# Patient Record
Sex: Female | Born: 1975 | Race: White | Hispanic: No | Smoking: Current every day smoker
Health system: Southern US, Community
[De-identification: ages and names within clinical notes are randomized; demographics above are authoritative.]

## PROBLEM LIST (undated history)

## (undated) DIAGNOSIS — K589 Irritable bowel syndrome without diarrhea: Secondary | ICD-10-CM

## (undated) DIAGNOSIS — F419 Anxiety disorder, unspecified: Secondary | ICD-10-CM

## (undated) DIAGNOSIS — F319 Bipolar disorder, unspecified: Secondary | ICD-10-CM

## (undated) DIAGNOSIS — F32A Depression, unspecified: Secondary | ICD-10-CM

## (undated) DIAGNOSIS — F99 Mental disorder, not otherwise specified: Secondary | ICD-10-CM

## (undated) DIAGNOSIS — B009 Herpesviral infection, unspecified: Secondary | ICD-10-CM

## (undated) DIAGNOSIS — N6320 Unspecified lump in the left breast, unspecified quadrant: Secondary | ICD-10-CM

## (undated) DIAGNOSIS — J45909 Unspecified asthma, uncomplicated: Secondary | ICD-10-CM

## (undated) DIAGNOSIS — K219 Gastro-esophageal reflux disease without esophagitis: Secondary | ICD-10-CM

## (undated) DIAGNOSIS — J9819 Other pulmonary collapse: Secondary | ICD-10-CM

## (undated) DIAGNOSIS — F329 Major depressive disorder, single episode, unspecified: Secondary | ICD-10-CM

## (undated) HISTORY — PX: TONSILLECTOMY: SUR1361

## (undated) HISTORY — PX: TUBAL LIGATION: SHX77

---

## 2000-01-27 ENCOUNTER — Other Ambulatory Visit: Admission: RE | Admit: 2000-01-27 | Discharge: 2000-01-27 | Payer: Self-pay | Admitting: Otolaryngology

## 2002-02-24 ENCOUNTER — Emergency Department (HOSPITAL_COMMUNITY): Admission: EM | Admit: 2002-02-24 | Discharge: 2002-02-24 | Payer: Self-pay | Admitting: Emergency Medicine

## 2002-03-19 ENCOUNTER — Other Ambulatory Visit: Admission: RE | Admit: 2002-03-19 | Discharge: 2002-03-19 | Payer: Self-pay | Admitting: Obstetrics and Gynecology

## 2002-04-05 ENCOUNTER — Inpatient Hospital Stay (HOSPITAL_COMMUNITY): Admission: AD | Admit: 2002-04-05 | Discharge: 2002-04-05 | Payer: Self-pay | Admitting: Obstetrics and Gynecology

## 2002-08-07 ENCOUNTER — Encounter: Payer: Self-pay | Admitting: Obstetrics and Gynecology

## 2002-08-07 ENCOUNTER — Inpatient Hospital Stay (HOSPITAL_COMMUNITY): Admission: AD | Admit: 2002-08-07 | Discharge: 2002-08-07 | Payer: Self-pay | Admitting: Obstetrics and Gynecology

## 2002-09-21 ENCOUNTER — Inpatient Hospital Stay (HOSPITAL_COMMUNITY): Admission: AD | Admit: 2002-09-21 | Discharge: 2002-09-21 | Payer: Self-pay | Admitting: Obstetrics and Gynecology

## 2002-11-12 ENCOUNTER — Inpatient Hospital Stay (HOSPITAL_COMMUNITY): Admission: AD | Admit: 2002-11-12 | Discharge: 2002-11-16 | Payer: Self-pay | Admitting: Obstetrics and Gynecology

## 2003-09-27 ENCOUNTER — Inpatient Hospital Stay (HOSPITAL_COMMUNITY): Admission: AD | Admit: 2003-09-27 | Discharge: 2003-09-27 | Payer: Self-pay | Admitting: Obstetrics and Gynecology

## 2003-12-01 ENCOUNTER — Inpatient Hospital Stay (HOSPITAL_COMMUNITY): Admission: AD | Admit: 2003-12-01 | Discharge: 2003-12-01 | Payer: Self-pay | Admitting: Obstetrics and Gynecology

## 2003-12-23 ENCOUNTER — Other Ambulatory Visit: Admission: RE | Admit: 2003-12-23 | Discharge: 2003-12-23 | Payer: Self-pay | Admitting: Obstetrics & Gynecology

## 2003-12-23 ENCOUNTER — Encounter (INDEPENDENT_AMBULATORY_CARE_PROVIDER_SITE_OTHER): Payer: Self-pay | Admitting: Specialist

## 2003-12-23 ENCOUNTER — Encounter (INDEPENDENT_AMBULATORY_CARE_PROVIDER_SITE_OTHER): Payer: Self-pay | Admitting: *Deleted

## 2003-12-23 ENCOUNTER — Encounter: Admission: RE | Admit: 2003-12-23 | Discharge: 2003-12-23 | Payer: Self-pay | Admitting: Obstetrics and Gynecology

## 2004-01-08 ENCOUNTER — Encounter: Admission: RE | Admit: 2004-01-08 | Discharge: 2004-01-08 | Payer: Self-pay | Admitting: Obstetrics and Gynecology

## 2004-09-29 ENCOUNTER — Inpatient Hospital Stay (HOSPITAL_COMMUNITY): Admission: AD | Admit: 2004-09-29 | Discharge: 2004-09-30 | Payer: Self-pay | Admitting: Obstetrics and Gynecology

## 2004-10-03 ENCOUNTER — Inpatient Hospital Stay (HOSPITAL_COMMUNITY): Admission: AD | Admit: 2004-10-03 | Discharge: 2004-10-04 | Payer: Self-pay | Admitting: Family Medicine

## 2004-10-05 ENCOUNTER — Ambulatory Visit: Payer: Self-pay | Admitting: Obstetrics and Gynecology

## 2004-10-12 ENCOUNTER — Ambulatory Visit: Payer: Self-pay | Admitting: Obstetrics and Gynecology

## 2005-01-07 ENCOUNTER — Inpatient Hospital Stay (HOSPITAL_COMMUNITY): Admission: AD | Admit: 2005-01-07 | Discharge: 2005-01-08 | Payer: Self-pay | Admitting: Obstetrics & Gynecology

## 2005-06-06 ENCOUNTER — Inpatient Hospital Stay (HOSPITAL_COMMUNITY): Admission: AD | Admit: 2005-06-06 | Discharge: 2005-06-06 | Payer: Self-pay | Admitting: Obstetrics & Gynecology

## 2006-02-15 ENCOUNTER — Inpatient Hospital Stay (HOSPITAL_COMMUNITY): Admission: AD | Admit: 2006-02-15 | Discharge: 2006-02-15 | Payer: Self-pay | Admitting: Obstetrics and Gynecology

## 2006-05-31 ENCOUNTER — Inpatient Hospital Stay (HOSPITAL_COMMUNITY): Admission: AD | Admit: 2006-05-31 | Discharge: 2006-05-31 | Payer: Self-pay | Admitting: Gynecology

## 2006-06-02 ENCOUNTER — Ambulatory Visit (HOSPITAL_COMMUNITY): Admission: RE | Admit: 2006-06-02 | Discharge: 2006-06-02 | Payer: Self-pay | Admitting: Family Medicine

## 2006-06-05 ENCOUNTER — Emergency Department (HOSPITAL_COMMUNITY): Admission: EM | Admit: 2006-06-05 | Discharge: 2006-06-05 | Payer: Self-pay | Admitting: Emergency Medicine

## 2006-06-15 ENCOUNTER — Ambulatory Visit: Payer: Self-pay | Admitting: Gastroenterology

## 2006-06-20 ENCOUNTER — Ambulatory Visit: Payer: Self-pay | Admitting: Gastroenterology

## 2006-06-27 ENCOUNTER — Ambulatory Visit: Payer: Self-pay | Admitting: Gastroenterology

## 2006-06-28 ENCOUNTER — Ambulatory Visit: Payer: Self-pay | Admitting: Obstetrics and Gynecology

## 2006-08-08 DIAGNOSIS — J9819 Other pulmonary collapse: Secondary | ICD-10-CM

## 2006-08-08 HISTORY — DX: Other pulmonary collapse: J98.19

## 2006-08-29 ENCOUNTER — Ambulatory Visit: Payer: Self-pay | Admitting: Gastroenterology

## 2006-10-29 ENCOUNTER — Inpatient Hospital Stay (HOSPITAL_COMMUNITY): Admission: EM | Admit: 2006-10-29 | Discharge: 2006-11-03 | Payer: Self-pay | Admitting: Emergency Medicine

## 2006-11-20 ENCOUNTER — Ambulatory Visit (HOSPITAL_COMMUNITY): Admission: RE | Admit: 2006-11-20 | Discharge: 2006-11-20 | Payer: Self-pay | Admitting: General Surgery

## 2006-11-29 ENCOUNTER — Emergency Department (HOSPITAL_COMMUNITY): Admission: EM | Admit: 2006-11-29 | Discharge: 2006-11-30 | Payer: Self-pay | Admitting: Emergency Medicine

## 2006-12-05 ENCOUNTER — Encounter: Admission: RE | Admit: 2006-12-05 | Discharge: 2007-01-05 | Payer: Self-pay | Admitting: Physician Assistant

## 2007-01-29 ENCOUNTER — Encounter: Admission: RE | Admit: 2007-01-29 | Discharge: 2007-03-28 | Payer: Self-pay | Admitting: Physician Assistant

## 2007-02-05 ENCOUNTER — Ambulatory Visit: Payer: Self-pay | Admitting: Pulmonary Disease

## 2007-02-12 ENCOUNTER — Encounter: Admission: RE | Admit: 2007-02-12 | Discharge: 2007-05-13 | Payer: Self-pay | Admitting: Internal Medicine

## 2007-03-29 ENCOUNTER — Inpatient Hospital Stay (HOSPITAL_COMMUNITY): Admission: AD | Admit: 2007-03-29 | Discharge: 2007-03-29 | Payer: Self-pay | Admitting: Obstetrics and Gynecology

## 2007-08-25 ENCOUNTER — Emergency Department (HOSPITAL_COMMUNITY): Admission: EM | Admit: 2007-08-25 | Discharge: 2007-08-25 | Payer: Self-pay | Admitting: Emergency Medicine

## 2007-09-23 ENCOUNTER — Emergency Department (HOSPITAL_COMMUNITY): Admission: EM | Admit: 2007-09-23 | Discharge: 2007-09-23 | Payer: Self-pay | Admitting: Emergency Medicine
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
## 2008-01-28 ENCOUNTER — Inpatient Hospital Stay (HOSPITAL_COMMUNITY): Admission: AD | Admit: 2008-01-28 | Discharge: 2008-01-28 | Payer: Self-pay | Admitting: Obstetrics & Gynecology

## 2008-02-04 ENCOUNTER — Inpatient Hospital Stay (HOSPITAL_COMMUNITY): Admission: AD | Admit: 2008-02-04 | Discharge: 2008-02-04 | Payer: Self-pay | Admitting: Obstetrics & Gynecology

## 2008-08-15 ENCOUNTER — Encounter: Payer: Self-pay | Admitting: Gastroenterology

## 2008-08-15 ENCOUNTER — Encounter: Admission: RE | Admit: 2008-08-15 | Discharge: 2008-08-15 | Payer: Self-pay | Admitting: Family Medicine

## 2008-08-17 ENCOUNTER — Emergency Department (HOSPITAL_COMMUNITY): Admission: EM | Admit: 2008-08-17 | Discharge: 2008-08-18 | Payer: Self-pay | Admitting: Emergency Medicine

## 2008-08-18 ENCOUNTER — Encounter: Payer: Self-pay | Admitting: Gastroenterology

## 2008-09-01 ENCOUNTER — Encounter: Payer: Self-pay | Admitting: Gastroenterology

## 2008-09-17 DIAGNOSIS — R1033 Periumbilical pain: Secondary | ICD-10-CM | POA: Insufficient documentation

## 2008-09-17 HISTORY — DX: Periumbilical pain: R10.33

## 2008-09-22 ENCOUNTER — Ambulatory Visit: Payer: Self-pay | Admitting: Gastroenterology

## 2008-09-22 DIAGNOSIS — K589 Irritable bowel syndrome without diarrhea: Secondary | ICD-10-CM

## 2008-09-22 HISTORY — DX: Irritable bowel syndrome, unspecified: K58.9

## 2008-09-23 ENCOUNTER — Telehealth: Payer: Self-pay | Admitting: Gastroenterology

## 2008-09-25 ENCOUNTER — Ambulatory Visit: Payer: Self-pay | Admitting: Gastroenterology

## 2008-09-26 ENCOUNTER — Telehealth: Payer: Self-pay | Admitting: Gastroenterology

## 2008-10-10 ENCOUNTER — Telehealth: Payer: Self-pay | Admitting: Gastroenterology

## 2008-10-22 ENCOUNTER — Ambulatory Visit: Payer: Self-pay | Admitting: Gastroenterology

## 2009-01-17 ENCOUNTER — Emergency Department (HOSPITAL_COMMUNITY): Admission: EM | Admit: 2009-01-17 | Discharge: 2009-01-17 | Payer: Self-pay | Admitting: Emergency Medicine

## 2009-04-06 ENCOUNTER — Emergency Department (HOSPITAL_COMMUNITY): Admission: EM | Admit: 2009-04-06 | Discharge: 2009-04-06 | Payer: Self-pay | Admitting: Emergency Medicine

## 2010-07-14 ENCOUNTER — Ambulatory Visit (HOSPITAL_COMMUNITY)
Admission: RE | Admit: 2010-07-14 | Discharge: 2010-07-14 | Payer: Self-pay | Source: Home / Self Care | Attending: Sports Medicine | Admitting: Sports Medicine

## 2010-08-20 ENCOUNTER — Encounter
Admission: RE | Admit: 2010-08-20 | Discharge: 2010-09-07 | Payer: Self-pay | Source: Home / Self Care | Attending: Orthopedic Surgery | Admitting: Orthopedic Surgery

## 2010-08-28 ENCOUNTER — Encounter: Payer: Self-pay | Admitting: Family Medicine

## 2010-09-08 ENCOUNTER — Ambulatory Visit: Payer: Medicaid Other | Admitting: Physical Therapy

## 2010-09-08 DIAGNOSIS — M25569 Pain in unspecified knee: Secondary | ICD-10-CM | POA: Insufficient documentation

## 2010-09-08 DIAGNOSIS — IMO0001 Reserved for inherently not codable concepts without codable children: Secondary | ICD-10-CM | POA: Insufficient documentation

## 2010-09-08 DIAGNOSIS — R262 Difficulty in walking, not elsewhere classified: Secondary | ICD-10-CM | POA: Insufficient documentation

## 2010-09-08 DIAGNOSIS — M6281 Muscle weakness (generalized): Secondary | ICD-10-CM | POA: Insufficient documentation

## 2010-09-16 ENCOUNTER — Ambulatory Visit: Payer: Medicaid Other | Attending: Orthopedic Surgery | Admitting: Physical Therapy

## 2010-11-13 LAB — URINALYSIS, ROUTINE W REFLEX MICROSCOPIC
Bilirubin Urine: NEGATIVE
Hgb urine dipstick: NEGATIVE
Ketones, ur: NEGATIVE mg/dL
Nitrite: NEGATIVE
Specific Gravity, Urine: 1.009 (ref 1.005–1.030)
Urobilinogen, UA: 0.2 mg/dL (ref 0.0–1.0)
pH: 6.5 (ref 5.0–8.0)

## 2010-11-13 LAB — PREGNANCY, URINE: Preg Test, Ur: NEGATIVE

## 2010-11-15 LAB — URINALYSIS, ROUTINE W REFLEX MICROSCOPIC
Ketones, ur: 15 mg/dL — AB
Protein, ur: 30 mg/dL — AB
Urobilinogen, UA: 1 mg/dL (ref 0.0–1.0)

## 2010-11-15 LAB — URINE MICROSCOPIC-ADD ON

## 2010-11-22 LAB — BASIC METABOLIC PANEL
Chloride: 103 mEq/L (ref 96–112)
GFR calc Af Amer: >60 mL/min (ref >60)
GFR calc non Af Amer: >60 mL/min (ref >60)
Potassium: 3.3 mEq/L — ABNORMAL LOW (ref 3.5–5.1)
Sodium: 138 mEq/L (ref 135–145)

## 2010-11-22 LAB — CBC
HCT: 36 % (ref 36.0–46.0)
MCV: 101.4 fL — ABNORMAL HIGH (ref 78.0–100.0)
RBC: 3.56 MIL/uL — ABNORMAL LOW (ref 3.87–5.11)
WBC: 7.2 10*3/uL (ref 4.0–10.5)

## 2010-11-22 LAB — DIFFERENTIAL
Eosinophils Absolute: 0.2 10*3/uL (ref 0.0–0.7)
Eosinophils Relative: 2 % (ref 0–5)
Lymphocytes Relative: 51 % — ABNORMAL HIGH (ref 12–46)
Lymphs Abs: 3.7 10*3/uL (ref 0.7–4.0)
Monocytes Relative: 6 % (ref 3–12)
Neutrophils Relative %: 40 % — ABNORMAL LOW (ref 43–77)

## 2010-11-22 LAB — URINALYSIS, ROUTINE W REFLEX MICROSCOPIC
Glucose, UA: NEGATIVE mg/dL
Hgb urine dipstick: NEGATIVE
Specific Gravity, Urine: 1.004 — ABNORMAL LOW (ref 1.005–1.030)
pH: 6.5 (ref 5.0–8.0)

## 2010-11-22 LAB — HEPATIC FUNCTION PANEL
ALT: 23 U/L (ref 0–35)
Albumin: 3.5 g/dL (ref 3.5–5.2)
Alkaline Phosphatase: 40 U/L (ref 39–117)
Total Bilirubin: 0.4 mg/dL (ref 0.3–1.2)

## 2010-11-22 LAB — HEPATITIS PANEL, ACUTE
HCV Ab: NEGATIVE
Hep A IgM: NEGATIVE

## 2010-11-22 LAB — LIPASE, BLOOD: Lipase: 16 U/L (ref 11–59)

## 2010-11-22 LAB — POCT PREGNANCY, URINE: Preg Test, Ur: NEGATIVE
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
## 2010-12-24 NOTE — Letter (Signed)
June 15, 2006    Dr. Nira Retort  [address]  [place], [place] [address]   RE:  Christina [name], Christina [name]  MRN:  [id number]  /  DOB:  12-Jun-1976   Dear Dr. Esperanza Richters:   Upon your kind referral, I had the pleasure of evaluating your patient and I  am pleased to offer my findings.  I saw [name]  in the office today.  Enclosed is a copy of my progress note that details my findings and  recommendations.   Thank you for the opportunity to participate in your patient's care.    Sincerely,      Barbette Hair. Arlyce Dice, MD,FACG  Electronically Signed    RDK/MedQ  DD: 06/15/2006  DT: 06/15/2006  Job #: [id number]

## 2010-12-24 NOTE — Assessment & Plan Note (Signed)
[facility]                           GASTROENTEROLOGY OFFICE NOTE   RYLAND, SMOOTS                     MRN:          [id number]  DATE:06/27/2006                            DOB:          09-Nov-1975    PROBLEM:  Abdominal pain.   Miss March has returned for scheduled GI followup. Colonoscopy was entirely  unremarkable. On a regimen of lactulose 15 cc twice a day, her abdominal  pain has significantly decreased. She is now moving her bowels 1-2 times a  day. She does still report some crampy lower abdominal pain, although it is  much less severe and less frequent.   PHYSICAL EXAMINATION:  Pulse is 78, blood pressure is 110/60, weight is 171.   IMPRESSION:  Lower abdominal pain-likely secondary to functional  constipation.   RECOMMENDATIONS:  1. Continue lactulose.  2. NuLev 0.25 mg sublingual q. 4 hours p.r.n.     Barbette Hair. Arlyce Dice, MD,FACG  Electronically Signed    RDK/MedQ  DD: 06/27/2006  DT: 06/27/2006  Job #: [id number]   cc:   Guy Sandifer. Henderson Cloud, M.D.

## 2010-12-24 NOTE — Assessment & Plan Note (Signed)
[facility]                           GASTROENTEROLOGY OFFICE NOTE   Christina [name], [name]                     MRN:          [id number]  DATE:06/15/2006                            DOB:          1976/03/05    REASON FOR CONSULTATION:  Abdominal pain.   Christina [name] [name] is a 35 year old white female referred through the courtesy of Dr.  Henderson Cloud for evaluation. For the last month, she has been complaining of  severe lower abdominal pain. The pain is constant. It is actually worsened  with defecation. While she has suffered from constipation over the last 2-3  years, constipation has become especially severe over the last month. She  will go 3-4 days without a spontaneous bowel movement. Bowel movements are  very painful, worsening her pain in the lower abdomen. She has also noted  blood mixed with her stools and in the toilet water. She has had occasional  nausea and has vomited with pain. She has tried fibers, which have only  caused excess flatus. She has lower abdominal pain when she passes flatus as  well. She was seen in the emergency room on June 05, 2006. Evaluation  included an ultrasound of the abdomen and pelvis, which demonstrated a  simple left ovarian cyst. Urinalysis was negative. BMET and CBC were also  unremarkable, except for an MCV of 101.2. She claims she has had black  stools in the last few weeks as well.   PAST MEDICAL HISTORY:  Is unremarkable.   FAMILY HISTORY:  Pertinent for mother and grandmother with ovarian cancer  and mother with breast cancer.   MEDICATIONS:  Include Bactrim and Valtrex and oral contraceptives.   ALLERGIES:  She has no allergies.   She smokes less than a pack a day and she drinks socially. 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   REVIEW OF SYSTEMS:  Is positive for excess urination which she attributes to  excess water intake.   PHYSICAL EXAMINATION:  Pulse is 80. Blood pressure is 108/80, weight  is 169.  HEENT: EOMI. PERRLA. Sclerae are anicteric.  Conjunctivae are pink.  NECK:  Supple without thyromegaly, adenopathy or carotid bruits.  CHEST:  Clear to auscultation and percussion without adventitious sounds.  CARDIAC:  Regular rhythm; normal S1 S2.  There are no murmurs, gallops or  rubs.  ABDOMEN:  She has mild tenderness in the left lower quadrant without  guarding or rebound. There are no abdominal masses or organomegaly. Bowel  sounds are normoactive.  Abdomen is soft and non-distended.  EXTREMITIES:  Full range of motion.  No cyanosis, clubbing or edema.  RECTAL:  Deferred.   IMPRESSION:  Persistent lower abdominal pain and worsening constipation with  rectal bleeding. I suspect her symptoms are due to underlying constipation.  Rectal bleeding could certainly be hemorrhoidal though a structural lesion  of the colon ought to be ruled out.   RECOMMENDATION:  1. Colonoscopy. If negative, I will work on a bowel regimen.  2. Levbid 0.375 mg twice a day as needed.     Barbette Hair. Arlyce Dice, MD,FACG  Electronically Signed  RDK/MedQ  DD: 06/15/2006  DT: 06/15/2006  Job #: [id number]   cc:   Guy Sandifer. Henderson Cloud, M.D.

## 2010-12-24 NOTE — Group Therapy Note (Signed)
NAME:  Christina [name], BRINSON                        ACCOUNT NO.:  [id number]   MEDICAL RECORD NO.:  [id number]                   PATIENT TYPE:  OUT   LOCATION:  [facility]                           FACILITY:  [facility]   PHYSICIAN:  Tinnie Gens, MD                     DATE OF BIRTH:  Dec 23, 1975   DATE OF SERVICE:  01/08/2004                                    CLINIC NOTE   CHIEF COMPLAINT:  She comes today for follow-up colposcopy results.  Apparently she had a Pap that showed low-grade SIL, a repeat Pap with high-  risk HPV, and colposcopy with ECC was done here 2 weeks ago.  Her ECC was  negative.  Her repeat Pap had benign reparative changes.  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  I explained all this to the patient and explained to her what  it meant.  She asked what caused it.  She was explained about HPV, its  effects on the cervix, and its sexual transmission.  The patient became very  upset at this point and actually started sobbing.  She said that she used to  be on Zoloft and Paxil but she is not on them anymore and that she feels  like she still needs them.  She is a single mother with three kids.  She  also has HSV and she needs some Valtrex and is overly concerned about this  diagnosis and possible transmission to her partner.  The patient also needs  birth control.  She has been on Modicon from the health department is  unhappy with this.  She was on Ortho Tri-Cyclen previously and would like to  continue on this.   PLAN:  1. Advised the patient to follow up in 6 months for a repeat Pap smear.  2. Valtrex 500 mg one p.o. daily - seven samples given plus prescription.  3. Paxil 20 mg one p.o. daily - prescription written.  4. Ortho Tri-Cyclen - prescription for this also given to the patient.  5. The patient will follow up in 2 months for her depression.                                               Tinnie Gens, MD    TP/MEDQ  D:  01/08/2004  T:  01/09/2004  Job:  [id number]

## 2010-12-24 NOTE — Discharge Summary (Signed)
NAME:  Christina [name], Christina [name]                        ACCOUNT NO.:  [id number]   MEDICAL RECORD NO.:  [id number]                   PATIENT TYPE:  INP   LOCATION:  9137                                 [facility]ACILITY:  [facility]   PHYSICIAN:  Randye Lobo, M.D.                DATE OF BIRTH:  1976/01/18   DATE OF ADMISSION:  11/12/2002  DATE OF DISCHARGE:  11/16/2002                                 DISCHARGE SUMMARY   FINAL DIAGNOSES:  1. Intrauterine pregnancy at term.  2. History of prior classical cesarean section.  3. Probable low grade endometritis.   PROCEDURE:  Repeat cesarean section.   SURGEON:  Malva Limes, M.D.   ASSISTANT:  Luvenia Redden, M.D.   COMPLICATIONS:  None.   HISTORY OF PRESENT ILLNESS:  This 35 year old G2, P0-1-0-2 presents at [redacted]  weeks gestation for repeat cesarean section.  The patient did have a  classical cesarean section with her last pregnancy and therefore, of course,  a cesarean section is needed with this pregnancy.  The patient's antepartum  course had been complicated, of course, by her history of cesarean section,  history of HSV type 2.  The patient also was a smoker.  Otherwise, patient's  antepartum course had been uncomplicated.  I do not see patient's group B  Strep status at this time.  The patient was taken to the operating room on  November 13, 2002 by Malva Limes, M.D. where repeat cesarean section was  performed with the delivery of a 7 pound 5 ounce female infant with Apgars  of 9 and 9.  The delivery went without complications.  The patient's  postoperative course was complicated by a tender uterus.  On postoperative  day number three the patient started developing abdominal pain.  She was  started on Augmentin 875 mg one b.i.d.  Her white blood cell count at that  point was just 13.7.  By postoperative day number four patient was feeling  better.  Her uterus was firm, slightly tender and incision looked great.  She was felt ready for discharge  with questionable low grade endometritis.  She was sent home and told to continue her Augmentin 875 mg one b.i.d. for  seven more days.  Was given prescription for Percocet one to two q.4h. as  needed for pain.  Told to continue her prenatal vitamins.  Was told she  could use Ibuprofen 600 mg one q.6-8h. p.r.n.  Was to follow up in the  office in four weeks.  Reported to call if any increased pain or fevers.   DISCHARGE LABORATORIES:  The patient had hemoglobin of 12.7, white blood  cell count of 10.2.     Leilani Able, P.A.-C.                Randye Lobo, M.D.    MB/MEDQ  D:  12/16/2002  T:  12/16/2002  Job:  [id number]

## 2010-12-24 NOTE — Letter (Signed)
June 15, 2006    [name]  [address]  [place], [place] [address]   RE:  Christina [name], Christina [name]  MRN:  [id number]  /  DOB:  Jan 19, 1976   Dear [name]:   It is my pleasure to have treated you recently as a new patient in my  office.  I appreciate your confidence and the opportunity to participate in  your care.   Since I do have a busy inpatient endoscopy schedule and office schedule, my  office hours vary weekly.  I am, however, available for emergency calls  every day through my office.  If I cannot promptly meet an urgent office  appointment, another one of our gastroenterologists will be able to assist  you.   My well-trained staff are prepared to help you at all times.  For  emergencies after office hours, a physician from our gastroenterology  section is always available through my 24-hour answering service.   While you are under my care, I encourage discussion of your questions and  concerns, and I will be happy to return your calls as soon as I am  available.   Once again, I welcome you as a new patient and I look forward to a happy and  healthy relationship.    Sincerely,      Barbette Hair. Arlyce Dice, MD,FACG  Electronically Signed    RDK/MedQ  DD: 06/15/2006  DT: 06/15/2006  Job #: [id number]

## 2010-12-24 NOTE — Op Note (Signed)
NAME:  Christina [name], Christina [name]                        ACCOUNT NO.:  [id number]   MEDICAL RECORD NO.:  [id number]                   PATIENT TYPE:  INP   LOCATION:  9143                                 [facility]ACILITY:  [facility]   PHYSICIAN:  Malva Limes, M.D.                 DATE OF BIRTH:  1975-12-03   DATE OF PROCEDURE:  11/12/2002  DATE OF DISCHARGE:                                 OPERATIVE REPORT   PREOPERATIVE DIAGNOSES:  1. Intrauterine pregnancy at term.  2. History of prior classical cesarean section.   POSTOPERATIVE DIAGNOSES:  1. Intrauterine pregnancy at term.  2. History of prior classical cesarean section.   PROCEDURE:  Repeat cesarean section.   SURGEON:  Malva Limes, M.D.   ASSISTANT:  Luvenia Redden, M.D.   ANESTHESIA:  Spinal.   ESTIMATED BLOOD LOSS:  900 mL.   COMPLICATIONS:  None.   SPECIMENS:  None.   DRAINS:  Foley to bedside drainage.   ANTIBIOTICS:  Ancef 1 g.   FINDINGS:  The patient delivered one live viable white female infant  weighing 7 pounds 5 ounces.  On exam it appeared that the patient had a  previous low transverse cesarean section based on the scar.  There was no  evidence of a classical cesarean section on the uterus.  My impression is it  was likely a transverse cesarean section.   DESCRIPTION OF PROCEDURE:  The patient was taken to the operating room,  where spinal anesthetic was administered without complications.  She was  then placed in the dorsal supine position with a left lateral tilt.  She was  prepped with Betadine and a Foley catheter was placed.  She was draped in  the usual fashion for this procedure.  A Pfannenstiel incision was made.  This was carried down to the fascia.  The fascia was entered in the midline  and extended laterally with the Mayo scissors.  The rectus muscles were  dissected from the fascia with the Bovie.  Rectus muscles were divided in  the midline and taken superiorly and inferiorly.  The parietal  peritoneum  was entered sharply and taken superiorly and inferiorly.  The bladder flap  was taken down sharply.  A low transverse uterine incision was made in the  lower uterine segment, which very attenuated.  The amniotic sac was entered  sharply and fluid was noted to be clear.  The incision was extended with  blunt dissection.  The infant was delivered with a vacuum extractor in the  vertex presentation.  On delivery of the head, the oropharynx and nostrils  were bulb-suctioned.  The remaining infant was then delivered, the cord  doubly clamped and cut, and the infant handed to the waiting anesthesia  team.  Cord blood was then obtained.  The placenta was manually removed,  appeared to be normal.  The uterine cavity was entered with wet laps.  The  uterine incision was closed in a single layer of 0 chromic in a running  locking fashion.  There was a small area of bleeding in the right angle,  which was made hemostatic with interrupted 0 Monocryl suture in figure-of-  eight fashion.  The bladder flap was closed using 3-0 chromic in a running  fashion.  The uterus was placed back into the abdominal cavity after  inspecting the ovaries and fallopian tubes.  The parietal peritoneum and  rectus muscles were reapproximated in the midline using 3-0 chromic in a  running fashion.  The fascia was closed using 0 Monocryl suture in running  fashion.  The skin was closed using stainless steel clips.  The patient  tolerated the procedure well.  She was taken to the recovery room in stable  condition.  Instrument and lap counts were correct x2.                                               Malva Limes, M.D.    MA/MEDQ  D:  11/12/2002  T:  11/13/2002  Job:  [id number]

## 2010-12-24 NOTE — Group Therapy Note (Signed)
Christina [name], WEICH NO.:  [id number]   MEDICAL RECORD NO.:  [id number]          PATIENT TYPE:  WOC   LOCATION:  [facility]                   FACILITY:  [facility]   PHYSICIAN:  Argentina Donovan, MD        DATE OF BIRTH:  [birth year]   DATE OF SERVICE:  06/28/2006                                    CLINIC NOTE   The patient is a 35 year old divorced white female gravida 2, para 2-0-0-3  whose  first babies twins are 13 years old and her last baby is 61 years old.  Over the past 2 years, she has had some problem with dysfunctional uterine  bleeding; has been placed finally on Ortho Tri-Cyclen which she tolerates  fairly well.  Yaz was intolerable.  The last month and a half, she has had  severe disabling lower abdominal cramps and pain with almost continual on  and off spotting or bleeding where she had to wear a pad almost every day.  She had a colonoscopy recently because of the cramping which was negative.   PHYSICAL EXAMINATION:  External genitalia is normal.  __________  is within  normal limits.  Vagina is clean and well rugated.  Cervix is clean and  parous.  Uterus anterior normal size, shape, consistency and nontender.  The  adnexa is normal.  The cul-de-sac is free.   We have talked to her about the options and we have decided that either the  problem is hormonal or anatomical.  To that effect, we are going to put her  on 2 tablets a day of Loestrin 28 FE for 2 months.  If this helps, we will  try and find her a pill that is stronger than the one she is on now, but  will not put weight on her which is a big worry for her.   This plan is carried out because at this point, her Medicaid will not cover  pills again this month.  If in fact there is no relief from her complaints  of dysmenorrhea and menorrhagia, we will admit her for [facility] and diagnostic  hysteroscopy with the possibility that she may have endometriosis, although  the symptoms do not sound as if this is a  problem; or adenomyosis which may  be the problem.  In any case, we will give her an appointment to come back  in 2 months.  If the symptoms persist, she will call prior to that time.           ______________________________  Argentina Donovan, MD     PR/MEDQ  D:  06/28/2006  T:  06/28/2006  Job:  [id number]

## 2010-12-24 NOTE — Assessment & Plan Note (Signed)
[facility]                         GASTROENTEROLOGY OFFICE NOTE   Christina [name], Christina [name]                     MRN:          [id number]  DATE:08/29/2006                            DOB:          1975-09-25    PROBLEM:  Constipation.   Christina [name] has returned for interval followup.  She initially was doing  quite well on Lactulose 15 mL twice a day.  She now reports increasing  constipation again.  She will go 2-3 days without a bowel movement.  With constipation she has lower abdominal pain.   ON EXAM:  VITAL SIGNS:  Pulse 68.  Blood pressure 84/52.  Weight 171.   IMPRESSION:  Idiopathic constipation.   RECOMMENDATIONS:  Increase Lactulose to 30 mL in the morning and 15-30  mL in the evening.  Failing this, I would try her on Amitiza.     Barbette Hair. Arlyce Dice, MD,FACG  Electronically Signed    RDK/MedQ  DD: 08/29/2006  DT: 08/29/2006  Job #: [id number]   cc:   Guy Sandifer. Henderson Cloud, M.D.
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
## 2010-12-24 NOTE — Group Therapy Note (Signed)
NAME:  Christina [name], PAYMENT NO.:  [id number]   MEDICAL RECORD NO.:  [id number]          PATIENT TYPE:  WOC   LOCATION:  [facility]                   FACILITY:  [facility]   PHYSICIAN:  Argentina Donovan, MD        DATE OF BIRTH:  Jan 31, 1976   DATE OF SERVICE:  10/05/2004                                    CLINIC NOTE   REASON FOR VISIT:  The patient is a 35 year old gravida 3 para 3-0-0-3 who  has been into the MAU twice within the last week because of onset of lower  abdominal pain, passage of heavy bleeding with clots, and has been on Ortho  Tri-Cyclen for at least 2 years with no problem. Has been unable to have sex  because of the pain and says from June she has been difficult for her to use  tampons because of the dryness and pressure. Ultrasound was done in the MAU  which showed a 3 cm clear functional cyst on the right ovary.   On examination, external genitalia is normal, BUS within normal limits. The  vagina is clean, well rugated, the cervix is clean and parous. Pap smear was  taken. The uterus is anterior; normal size, shape, and consistency; not  terribly painful to palpation. Tenderness in the right adnexa. The right  adnexa could not be well palpated. The abdomen is soft, flat, nontender to  deep palpation, without rebound or guarding, and the breasts are symmetrical  with no dominant masses.   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 She had a normal  hemoglobin and white count in the MAU. My feeling is this is probably  breakthrough bleeding secondary to the pill, and we are going to switch her  to Yasmin. I am having her stop the pills today - Tuesday - and restart them  on Sunday. 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 However, she has  not had an outbreak in 2 years. Discussing with the  patient, we gave her the  reasons this could possibly be happening. The one that we discussed in  detail was adenomyosis although because of the abrupt onset of this I doubt  that is the problem.   IMPRESSION:  Menorrhagia and dysmenorrhea. I am going to see the patient  again in 9 days, at which time we think she should be improving.      PR/MEDQ  D:  10/05/2004  T:  10/06/2004  Job:  [id number]

## 2011-02-04 ENCOUNTER — Emergency Department (HOSPITAL_COMMUNITY)
Admission: EM | Admit: 2011-02-04 | Discharge: 2011-02-04 | Disposition: A | Discharge status: Home / Self Care | Payer: Medicaid Other | Attending: Emergency Medicine | Admitting: Emergency Medicine

## 2011-02-04 DIAGNOSIS — Y92009 Unspecified place in unspecified non-institutional (private) residence as the place of occurrence of the external cause: Secondary | ICD-10-CM | POA: Insufficient documentation

## 2011-02-04 DIAGNOSIS — F341 Dysthymic disorder: Secondary | ICD-10-CM | POA: Insufficient documentation

## 2011-02-04 DIAGNOSIS — S81009A Unspecified open wound, unspecified knee, initial encounter: Secondary | ICD-10-CM | POA: Insufficient documentation

## 2011-02-04 DIAGNOSIS — Z79899 Other long term (current) drug therapy: Secondary | ICD-10-CM | POA: Insufficient documentation

## 2011-02-04 DIAGNOSIS — W269XXA Contact with unspecified sharp object(s), initial encounter: Secondary | ICD-10-CM | POA: Insufficient documentation

## 2011-04-28 LAB — I-STAT 8, (EC8 V) (CONVERTED LAB)
Acid-Base Excess: 5 — ABNORMAL HIGH
BUN: 10
Bicarbonate: 29.9 — ABNORMAL HIGH
HCT: 43
Hemoglobin: 14.6
Operator id: 295021
Sodium: 141
TCO2: 31
pCO2, Ven: 45.2

## 2011-04-28 LAB — URINALYSIS, ROUTINE W REFLEX MICROSCOPIC
Bilirubin Urine: NEGATIVE
Glucose, UA: NEGATIVE
Hgb urine dipstick: NEGATIVE
Ketones, ur: NEGATIVE
Protein, ur: NEGATIVE
Urobilinogen, UA: 2 — ABNORMAL HIGH

## 2011-04-28 LAB — URINE CULTURE: Culture: NO GROWTH

## 2011-04-28 LAB — POCT I-STAT CREATININE: Creatinine, Ser: 0.8

## 2011-05-05 LAB — URINE CULTURE

## 2011-05-05 LAB — URINALYSIS, ROUTINE W REFLEX MICROSCOPIC
Bilirubin Urine: NEGATIVE
Bilirubin Urine: NEGATIVE
Hgb urine dipstick: NEGATIVE
Ketones, ur: NEGATIVE
Nitrite: NEGATIVE
Specific Gravity, Urine: 1.02
Urobilinogen, UA: 0.2
pH: 6

## 2011-05-05 LAB — URINE MICROSCOPIC-ADD ON

## 2011-05-05 LAB — POCT PREGNANCY, URINE
Operator id: 277691
Preg Test, Ur: NEGATIVE

## 2011-05-20 LAB — URINE MICROSCOPIC-ADD ON

## 2011-05-20 LAB — URINALYSIS, ROUTINE W REFLEX MICROSCOPIC
Glucose, UA: 100 — AB
Ketones, ur: NEGATIVE
pH: 6

## 2011-08-04 ENCOUNTER — Inpatient Hospital Stay (HOSPITAL_COMMUNITY)
Admission: AD | Admit: 2011-08-04 | Discharge: 2011-08-05 | Disposition: A | Discharge status: Home / Self Care | Payer: Medicaid Other | Source: Ambulatory Visit | Attending: Obstetrics and Gynecology | Admitting: Obstetrics and Gynecology

## 2011-08-04 DIAGNOSIS — R109 Unspecified abdominal pain: Secondary | ICD-10-CM | POA: Insufficient documentation

## 2011-08-04 DIAGNOSIS — O99891 Other specified diseases and conditions complicating pregnancy: Secondary | ICD-10-CM | POA: Insufficient documentation

## 2011-08-04 DIAGNOSIS — O26899 Other specified pregnancy related conditions, unspecified trimester: Secondary | ICD-10-CM

## 2011-08-04 NOTE — ED Provider Notes (Signed)
History     Chief Complaint  Patient presents with  . Abdominal Cramping   HPI This is a 35 y.o. who presents with c/o   LMP 11/27, lighter period than normal in November and October. +UPT 12/24, having cramping.         OB History    No data available      No past medical history on file.  No past surgical history on file.  No family history on file.  History  Substance Use Topics  . Smoking status: Not on file  . Smokeless tobacco: Not on file  . Alcohol Use: Not on file    Allergies: Allergies not on file  No prescriptions prior to admission    ROS As above  Physical Exam   Blood pressure 106/67, pulse 94, temperature 98.4 F (36.9 C), temperature source Oral, resp. rate 18, height 5\' 11"  (1.803 m), weight 232 lb 3.2 oz (105.325 kg), last menstrual period 07/05/2011.  Physical Exam Patient left AMA after [place]. US showed 4+ week pregnancy. Results for orders placed during the hospital encounter of 08/04/11 (from the past 24 hour(s))  HCG, QUANTITATIVE, PREGNANCY     Status: Abnormal   Collection Time   08/04/11 11:55 PM      Component Value Range   hCG, Beta Chain, Quant, S 551 (*) <5 (mIU/mL)  ABO/RH     Status: Normal   Collection Time   08/04/11 11:55 PM      Component Value Range   ABO/RH(D) A POS    CBC     Status: Abnormal   Collection Time   08/04/11 11:55 PM      Component Value Range   WBC 10.1  4.0 - 10.5 (K/uL)   RBC 3.81 (*) 3.87 - 5.11 (MIL/uL)   Hemoglobin 13.7  12.0 - 15.0 (g/dL)   HCT 16.1  09.6 - 04.5 (%)   MCV 102.4 (*) 78.0 - 100.0 (fL)   MCH 36.0 (*) 26.0 - 34.0 (pg)   MCHC 35.1  30.0 - 36.0 (g/dL)   RDW 40.9  81.1 - 91.4 (%)   Platelets 302  150 - 400 (K/uL)    MAU Course  Procedures  Assessment and Plan  A:  Pregnancy      Unknown location of pregnancy P:  Would recommend repeat quant in 2 days       Patient left AMA after [place]  [facility] 08/04/2011, 11:51 PM

## 2011-08-04 NOTE — Progress Notes (Signed)
Pt LMP 11/27, lighter period than normal in November and October.  +UPT 12/24, having cramping.

## 2011-08-05 ENCOUNTER — Inpatient Hospital Stay (HOSPITAL_COMMUNITY): Payer: Medicaid Other

## 2011-08-05 LAB — CBC
HCT: 39 % (ref 36.0–46.0)
WBC: 10.1 10*3/uL (ref 4.0–10.5)

## 2011-08-05 LAB — POCT PREGNANCY, URINE: Preg Test, Ur: POSITIVE

## 2011-08-05 NOTE — ED Notes (Signed)
Pt returned from U/S to lobby.  Pt informed Security that she was leaving.

## 2011-08-08 NOTE — ED Provider Notes (Signed)
Agree with above note.  Christina [name] 08/08/2011 1:10 PM   

## 2011-08-20 ENCOUNTER — Inpatient Hospital Stay (HOSPITAL_COMMUNITY): Payer: Medicaid Other

## 2011-08-20 ENCOUNTER — Inpatient Hospital Stay (HOSPITAL_COMMUNITY)
Admission: AD | Admit: 2011-08-20 | Discharge: 2011-08-20 | Disposition: A | Discharge status: Home / Self Care | Payer: Medicaid Other | Source: Ambulatory Visit | Attending: Obstetrics and Gynecology | Admitting: Obstetrics and Gynecology

## 2011-08-20 ENCOUNTER — Encounter (HOSPITAL_COMMUNITY): Payer: Self-pay | Admitting: *Deleted

## 2011-08-20 DIAGNOSIS — Z1389 Encounter for screening for other disorder: Secondary | ICD-10-CM

## 2011-08-20 DIAGNOSIS — N83209 Unspecified ovarian cyst, unspecified side: Secondary | ICD-10-CM | POA: Insufficient documentation

## 2011-08-20 DIAGNOSIS — O34599 Maternal care for other abnormalities of gravid uterus, unspecified trimester: Secondary | ICD-10-CM | POA: Insufficient documentation

## 2011-08-20 DIAGNOSIS — O219 Vomiting of pregnancy, unspecified: Secondary | ICD-10-CM

## 2011-08-20 DIAGNOSIS — R109 Unspecified abdominal pain: Secondary | ICD-10-CM | POA: Insufficient documentation

## 2011-08-20 DIAGNOSIS — Z3490 Encounter for supervision of normal pregnancy, unspecified, unspecified trimester: Secondary | ICD-10-CM

## 2011-08-20 DIAGNOSIS — O210 Mild hyperemesis gravidarum: Secondary | ICD-10-CM | POA: Insufficient documentation

## 2011-08-20 HISTORY — DX: Depression, unspecified: F32.A

## 2011-08-20 HISTORY — DX: Bipolar disorder, unspecified: F31.9

## 2011-08-20 HISTORY — DX: Irritable bowel syndrome, unspecified: K58.9

## 2011-08-20 HISTORY — DX: Anxiety disorder, unspecified: F41.9

## 2011-08-20 HISTORY — DX: Major depressive disorder, single episode, unspecified: F32.9

## 2011-08-20 HISTORY — DX: Mental disorder, not otherwise specified: F99

## 2011-08-20 HISTORY — DX: Other pulmonary collapse: J98.19

## 2011-08-20 LAB — URINALYSIS, ROUTINE W REFLEX MICROSCOPIC
Glucose, UA: NEGATIVE mg/dL
Leukocytes, UA: NEGATIVE
Nitrite: NEGATIVE
Specific Gravity, Urine: 1.01 (ref 1.005–1.030)
pH: 6 (ref 5.0–8.0)

## 2011-08-20 LAB — WET PREP, GENITAL: Trich, Wet Prep: NONE SEEN

## 2011-08-20 LAB — DIFFERENTIAL
Basophils Relative: 0 % (ref 0–1)
Lymphocytes Relative: 40 % (ref 12–46)
Lymphs Abs: 5.3 10*3/uL — ABNORMAL HIGH (ref 0.7–4.0)
Monocytes Relative: 6 % (ref 3–12)
Neutro Abs: 6.8 10*3/uL (ref 1.7–7.7)
Neutrophils Relative %: 51 % (ref 43–77)

## 2011-08-20 LAB — CBC
HCT: 38.8 % (ref 36.0–46.0)
Hemoglobin: 13.5 g/dL (ref 12.0–15.0)
RBC: 3.83 MIL/uL — ABNORMAL LOW (ref 3.87–5.11)
WBC: 13.4 10*3/uL — ABNORMAL HIGH (ref 4.0–10.5)

## 2011-08-20 LAB — RAPID STREP SCREEN (MED CTR MEBANE ONLY): Streptococcus, Group A Screen (Direct): NEGATIVE

## 2011-08-20 MED ORDER — PROMETHAZINE HCL 25 MG PO TABS: 12.5000 mg | ORAL_TABLET | Freq: Four times a day (QID) | ORAL | Status: AC | PRN

## 2011-08-20 NOTE — Progress Notes (Signed)
Throat swab for rapid strep obtained at d/c. Will call pt only if test positive. Written and verbal d/c instructions given and understanding voiced

## 2011-08-20 NOTE — ED Notes (Signed)
Lidia Collum NP in to see pt. Spec exam done and wet prep and GC/Chlam obtained. Pt tol well.

## 2011-08-20 NOTE — Progress Notes (Signed)
Pt states, " I've had sharp cramping pain worse than a period in my low abdomen since 10:30 pm tonight. I have chills and hot flashes."

## 2011-08-20 NOTE — Progress Notes (Signed)
Written and verbal d/c instructions given and understanding voiced. 

## 2011-08-20 NOTE — ED Provider Notes (Signed)
History     CSN: [id number]  Arrival date & time 08/20/11  0128   None     Chief Complaint  Patient presents with  . Abdominal Pain    HPI Christina [name] is a 36 y.o. female who presents to MAU for severe abdominal pain. She had a previous visit 08/04/11 and at that time her Bhcg was 551, her blood type is A positive and her ultrasound showed no IUP and a 3.3 cm cystic lesion on the right ovary. The patient left AMA on that visit prior to having a pelvic exam or evaluation by anyone other than the RN. The history was provided by the patient and her previous chart.  No past medical history on file.  No past surgical history on file.  No family history on file.  History  Substance Use Topics  . Smoking status: Not on file  . Smokeless tobacco: Not on file  . Alcohol Use: Not on file    OB History    No data available      Review of Systems  Cardiovascular: Negative.   Gastrointestinal: Positive for nausea, vomiting and abdominal pain.  Genitourinary: Positive for frequency, vaginal discharge and pelvic pain. Negative for dysuria and vaginal bleeding.  Musculoskeletal: Positive for back pain.  Skin: Negative.   Neurological: Positive for headaches. Negative for light-headedness.  Psychiatric/Behavioral: The patient is not nervous/anxious.     Allergies  Review of patient's allergies indicates no known allergies.  Home Medications  No current outpatient prescriptions on file.  BP 102/57  Pulse 92  Temp(Src) 98.3 F (36.8 C) (Oral)  Resp 20  Ht 5\' 10"  (1.778 m)  Wt 235 lb 6 oz (106.765 kg)  BMI 33.77 kg/m2  LMP 07/05/2011  Physical Exam  Nursing note and vitals reviewed. Constitutional: She is oriented to person, place, and time. She appears well-developed and well-nourished. No distress.  HENT:  Head: Normocephalic.  Eyes: EOM are normal.  Neck: Neck supple.  Cardiovascular: Normal rate.   Pulmonary/Chest: Effort normal.  Abdominal: Soft. There is no  tenderness.  Genitourinary:       External genitalia without lesions. White discharge vaginal vault. Cervix closed, long, no CMT. Mildly tender right adnexa, Uterus slightly enlarged.   Musculoskeletal: Normal range of motion.  Neurological: She is alert and oriented to person, place, and time. No cranial nerve deficit.  Skin: Skin is warm and dry.  Psychiatric: She has a normal mood and affect. Her behavior is normal. Judgment and thought content normal.    ED Course  Procedures US Ob Transvaginal  08/20/2011  *RADIOLOGY REPORT*  Clinical Data: Pelvic pain.  TRANSVAGINAL OBSTETRIC US  Technique:  Transvaginal ultrasound was performed for complete evaluation of the gestation as well as the maternal uterus, adnexal regions, and pelvic cul-de-sac.  Comparison:  Pelvic ultrasound performed 08/05/2011  Intrauterine gestational sac: Single; normal shape. Yolk sac: Yes Embryo: Yes Cardiac Activity: Yes Heart Rate: 116  CRL: 5.1 mm           6   w  2   d        [place] EDC: 04/12/2012  Subchorionic hemorrhage: No subchorionic hemorrhage is noted.  Maternal uterus/adnexae: The uterus is otherwise unremarkable in appearance.  There appears to be a hemorrhagic cyst within the right ovary, measuring 3.3 cm in size, similar in appearance to the prior study, with internal lace-like echoes. The ovaries are otherwise unremarkable in appearance.  The right ovary measures 4.0 x 3.8  x 3.7 cm, while the left ovary measures 2.1 x 1.6 x 1.1 cm.  There is no evidence for ovarian torsion.  No free fluid is seen within the pelvic cul-de-sac.  IMPRESSION:  1.  Single live intrauterine pregnancy, with a crown-rump length of 5.1 mm, corresponding to a gestational age of [redacted] weeks 2 days.  This matches the gestational age of [redacted] weeks 4 days by LMP, reflecting an estimated date of delivery of April 10, 2012. 2.  Suspect small hemorrhagic cyst within the right ovary, stable from the prior study.  Original Report Authenticated By: Tonia Ghent, M.D.   Results for orders placed during the hospital encounter of 08/20/11 (from the past 24 hour(s))  CBC     Status: Abnormal   Collection Time   08/20/11  1:46 AM      Component Value Range   WBC 13.4 (*) 4.0 - 10.5 (K/uL)   RBC 3.83 (*) 3.87 - 5.11 (MIL/uL)   Hemoglobin 13.5  12.0 - 15.0 (g/dL)   HCT 16.1  09.6 - 04.5 (%)   MCV 101.3 (*) 78.0 - 100.0 (fL)   MCH 35.2 (*) 26.0 - 34.0 (pg)   MCHC 34.8  30.0 - 36.0 (g/dL)   RDW 40.9  81.1 - 91.4 (%)   Platelets 257  150 - 400 (K/uL)  DIFFERENTIAL     Status: Abnormal   Collection Time   08/20/11  1:46 AM      Component Value Range   Neutrophils Relative 51  43 - 77 (%)   Neutro Abs 6.8  1.7 - 7.7 (K/uL)   Lymphocytes Relative 40  12 - 46 (%)   Lymphs Abs 5.3 (*) 0.7 - 4.0 (K/uL)   Monocytes Relative 6  3 - 12 (%)   Monocytes Absolute 0.8  0.1 - 1.0 (K/uL)   Eosinophils Relative 3  0 - 5 (%)   Eosinophils Absolute 0.4  0.0 - 0.7 (K/uL)   Basophils Relative 0  0 - 1 (%)   Basophils Absolute 0.0  0.0 - 0.1 (K/uL)  HCG, QUANTITATIVE, PREGNANCY     Status: Abnormal   Collection Time   08/20/11  1:46 AM      Component Value Range   hCG, Beta Chain, Quant, S 78295 (*) <5 (mIU/mL)  URINALYSIS, ROUTINE W REFLEX MICROSCOPIC     Status: Normal   Collection Time   08/20/11  1:50 AM      Component Value Range   Color, Urine YELLOW  YELLOW    APPearance CLEAR  CLEAR    Specific Gravity, Urine 1.010  1.005 - 1.030    pH 6.0  5.0 - 8.0    Glucose, UA NEGATIVE  NEGATIVE (mg/dL)   Hgb urine dipstick NEGATIVE  NEGATIVE    Bilirubin Urine NEGATIVE  NEGATIVE    Ketones, ur NEGATIVE  NEGATIVE (mg/dL)   Protein, ur NEGATIVE  NEGATIVE (mg/dL)   Urobilinogen, UA 0.2  0.0 - 1.0 (mg/dL)   Nitrite NEGATIVE  NEGATIVE    Leukocytes, UA NEGATIVE  NEGATIVE   WET PREP, GENITAL     Status: Abnormal   Collection Time   08/20/11  2:20 AM      Component Value Range   Yeast, Wet Prep RARE (*) NONE SEEN    Trich, Wet Prep NONE SEEN  NONE SEEN     Clue Cells, Wet Prep FEW (*) NONE SEEN    WBC, Wet Prep HPF POC MODERATE (*) NONE SEEN  Re evaluation: After patient returned from ultrasound she is having no pain. Sleeping in exam room.  Assessment: Nausea and vomiting in pergnancy   Ovarian cyst   Viable IUP @ 6.4 weeks  Plan:  Phenergan Rx   Start prenatal care   Tylenol as needed for pain MDM     Kerrie Buffalo, NP 08/20/11 0449  Kerrie Buffalo, NP 08/20/11 0454 Patient request rapid strep screen. Throat with mild erythema. Small anterior cervical nodes palpable. Throat swab for strep screen. Discussed with patient we will only call if positive. She has an appointment with Dr. Dareen Piano to start prenatal care next week.   [place], [place] 08/20/11 [phone]

## 2011-08-21 NOTE — ED Provider Notes (Signed)
Attestation of Attending Supervision of Advanced Practitioner: Evaluation and management procedures were performed by the PA/NP/CNM/OB Fellow under my supervision/collaboration. Chart reviewed and agree with management and plan.  Janece Laidlaw V 08/21/2011 4:16 PM

## 2011-08-22 LAB — GC/CHLAMYDIA PROBE AMP, GENITAL
Chlamydia, DNA Probe: NEGATIVE
GC Probe Amp, Genital: NEGATIVE

## 2011-08-23 ENCOUNTER — Other Ambulatory Visit: Payer: Self-pay | Admitting: Obstetrics and Gynecology

## 2011-08-23 LAB — OB RESULTS CONSOLE ABO/RH: RH Type: POSITIVE

## 2011-08-23 LAB — OB RESULTS CONSOLE RUBELLA ANTIBODY, IGM: Rubella: IMMUNE

## 2011-09-14 ENCOUNTER — Other Ambulatory Visit: Payer: Self-pay

## 2011-09-20 ENCOUNTER — Other Ambulatory Visit (HOSPITAL_COMMUNITY): Payer: Self-pay | Admitting: Obstetrics and Gynecology

## 2011-09-20 ENCOUNTER — Ambulatory Visit (HOSPITAL_COMMUNITY)
Admission: RE | Admit: 2011-09-20 | Discharge: 2011-09-20 | Disposition: A | Discharge status: Home / Self Care | Payer: Medicaid Other | Source: Ambulatory Visit | Attending: Obstetrics and Gynecology | Admitting: Obstetrics and Gynecology

## 2011-09-20 ENCOUNTER — Encounter (HOSPITAL_COMMUNITY): Payer: Self-pay

## 2011-09-20 DIAGNOSIS — O09529 Supervision of elderly multigravida, unspecified trimester: Secondary | ICD-10-CM

## 2011-09-20 DIAGNOSIS — Z3682 Encounter for antenatal screening for nuchal translucency: Secondary | ICD-10-CM

## 2011-09-22 NOTE — Progress Notes (Signed)
Genetic Counseling  High-Risk Gestation Note  Appointment Date:  09/20/2011 Referred By: Levi Aland, MD Date of Birth:  August 12, 1975  Pregnancy History: [id number] Estimated Date of Delivery: 04/10/12 Estimated Gestational Age: [redacted]w[redacted]d Attending: Particia Nearing, MD    [name] was seen for genetic counseling because of a maternal age of 36 y.o..     She was counseled regarding maternal age and the association with risk for chromosome conditions due to nondisjunction with aging of the ova.   We reviewed chromosomes, nondisjunction, and the associated 1 in 114 risk for fetal aneuploidy related to a maternal age of 36 y.o. at [redacted]w[redacted]d gestation.  She was counseled that the risk for aneuploidy decreases as gestational age increases, accounting for those pregnancies which spontaneously abort.  We specifically discussed Down syndrome (trisomy 61), trisomies 79 and 80, and sex chromosome aneuploidies (47,XXX and 47,XXY) including the common features and prognoses of each.   We reviewed available screening and diagnostic options.  Regarding screening tests, we discussed the options of First screen, Quad screen and ultrasound.  We discussed another type of screening test, noninvasive prenatal testing (NIPT), which utilizes cell free fetal DNA found in the maternal circulation. This test is not diagnostic for chromosome conditions, but can provide information regarding the presence or absence of extra fetal DNA for chromosomes 13, 18 and 21. Thus, it would not identify or rule out all fetal aneuploidy. The reported detection rate is greater than 99% for Trisomy 21, greater than 97% for Trisomy 18, and is approximately 80% (8 out of 10) for Trisomy 13. The false positive rate is thought to be less than 0.1% for any of these conditions. She understands that screening tests are used to modify a patient's a priori risk for aneuploidy, typically based on age.  This estimate provides a pregnancy specific risk  assessment.  We also reviewed the availability of diagnostic options including CVS and amniocentesis.  We discussed the risks, limitations, and benefits of each.   After consideration of all the options, and a clear understanding of the newness and limitations of NIPT, [name] elected to proceed with cell free fetal DNA testing ([facility]) at the time of today's visit and elected to return for ultrasound for a nuchal translucency assessment, scheduled for 10/04/11. Those results will be available in 8-10 days and will be forwarded to her OB office when we receive them.  [name] declined CVS and amniocentesis.  She wishes to pursue these options to help ascertain her pregnancy specific risks for aneuploidy but stated that she is not considering CVS or amniocentesis in the pregnancy given the associated risk of complications.  She understands that ultrasound cannot rule out all birth defects or genetic syndromes. The patient was advised of this limitation and states she still does not want diagnostic testing at this time.  However, she was counseled that 50-80% of fetuses with Down syndrome and up to 90% of fetuses with trisomies 13 and 18, when well visualized, have detectable anomalies or soft markers by ultrasound.   [name] was provided with written information regarding cystic fibrosis (CF) including the carrier frequency and incidence in the Caucasian population, the availability of carrier testing and prenatal diagnosis if indicated.  In addition, we discussed that CF is routinely screened for as part of the [place] newborn screening panel.  She declined testing today.   Both family histories were reviewed and found to be noncontributory for birth defects, mental retardation, and known  genetic conditions. Without further information regarding the provided family history, an accurate genetic risk cannot be calculated. Further genetic counseling is warranted if more information is obtained.  The  father of the pregnancy is 6 years old. We discussed the 0.5% risk for the fetus to have a new autosomal dominant gene mutation related to a paternal age of over 60.  We also discussed the wide range of conditions which can be caused by new dominant gene mutations.  We discussed that genetic testing for advanced paternal age is not available at this time, though targeted ultrasound is available in the second trimester to assess fetal growth and development. As previously discussed, targeted ultrasound cannot diagnose or rule out all birth defects or genetic conditions prenatally.   [name] denied exposure to environmental toxins or chemical agents. She denied the use of alcohol or street drugs.  She reported smoking approximately 10 cigarettes per day. The associations of smoking in pregnancy were reviewed and cessation encouraged. She denied significant viral illnesses during the course of her pregnancy. Her medical and surgical histories were noncontributory.   I counseled [name] regarding the above risks and available options.  The approximate face-to-face time with the genetic counselor was 40 minutes.  Quinn Plowman, MS,  Certified Genetic Counselor 09/22/2011

## 2011-09-30 ENCOUNTER — Telehealth (HOSPITAL_COMMUNITY): Payer: Self-pay | Admitting: MS"

## 2011-09-30 NOTE — Telephone Encounter (Signed)
Called Christina [name] to discuss her [facility], cell free fetal DNA testing.  We reviewed that these are within normal limits, showing a less than 1 in 10,000 risk for trisomies 21, 18 and 13.  We reviewed that this testing identifies > 99% of pregnancies with trisomy 21, >97% of pregnancies with trisomy 60, and >80% with trisomy 40; the false positive rate is <0.1% for all conditions.  She understands that this testing does not identify all genetic conditions.  All questions were answered to her satisfaction, she was encouraged to call with additional questions or concerns.  Quinn Plowman, MS Patent attorney

## 2011-09-30 NOTE — Telephone Encounter (Signed)
Left message for patient to return call regarding good news.  

## 2011-10-03 ENCOUNTER — Other Ambulatory Visit: Payer: Self-pay

## 2011-10-04 ENCOUNTER — Encounter (HOSPITAL_COMMUNITY): Payer: Self-pay

## 2011-10-04 ENCOUNTER — Ambulatory Visit (HOSPITAL_COMMUNITY)
Admission: RE | Admit: 2011-10-04 | Discharge: 2011-10-04 | Disposition: A | Discharge status: Home / Self Care | Payer: Medicaid Other | Source: Ambulatory Visit | Attending: Obstetrics and Gynecology | Admitting: Obstetrics and Gynecology

## 2011-10-04 DIAGNOSIS — O09529 Supervision of elderly multigravida, unspecified trimester: Secondary | ICD-10-CM | POA: Insufficient documentation

## 2011-10-04 DIAGNOSIS — O3510X0 Maternal care for (suspected) chromosomal abnormality in fetus, unspecified, not applicable or unspecified: Secondary | ICD-10-CM | POA: Insufficient documentation

## 2011-10-04 DIAGNOSIS — Z3682 Encounter for antenatal screening for nuchal translucency: Secondary | ICD-10-CM

## 2011-10-04 DIAGNOSIS — Z3689 Encounter for other specified antenatal screening: Secondary | ICD-10-CM | POA: Insufficient documentation

## 2011-10-04 DIAGNOSIS — O34219 Maternal care for unspecified type scar from previous cesarean delivery: Secondary | ICD-10-CM | POA: Insufficient documentation

## 2011-10-04 DIAGNOSIS — E669 Obesity, unspecified: Secondary | ICD-10-CM | POA: Insufficient documentation

## 2011-10-04 DIAGNOSIS — O351XX0 Maternal care for (suspected) chromosomal abnormality in fetus, not applicable or unspecified: Secondary | ICD-10-CM | POA: Insufficient documentation

## 2012-03-23 ENCOUNTER — Encounter (HOSPITAL_COMMUNITY): Payer: Self-pay | Admitting: Pharmacist

## 2012-03-27 ENCOUNTER — Other Ambulatory Visit (HOSPITAL_COMMUNITY): Payer: Medicaid Other

## 2012-03-27 ENCOUNTER — Encounter (HOSPITAL_COMMUNITY): Payer: Self-pay

## 2012-03-28 ENCOUNTER — Encounter (HOSPITAL_COMMUNITY): Payer: Self-pay

## 2012-03-28 ENCOUNTER — Other Ambulatory Visit: Payer: Self-pay | Admitting: Obstetrics & Gynecology

## 2012-03-28 ENCOUNTER — Encounter (HOSPITAL_COMMUNITY)
Admission: RE | Admit: 2012-03-28 | Discharge: 2012-03-28 | Disposition: A | Discharge status: Home / Self Care | Payer: Medicaid Other | Source: Ambulatory Visit | Attending: Obstetrics & Gynecology | Admitting: Obstetrics & Gynecology

## 2012-03-28 HISTORY — DX: Gastro-esophageal reflux disease without esophagitis: K21.9

## 2012-03-28 LAB — CBC
MCH: 34 pg (ref 26.0–34.0)
MCHC: 34.3 g/dL (ref 30.0–36.0)
MCV: 99.2 fL (ref 78.0–100.0)
Platelets: 246 10*3/uL (ref 150–400)
RBC: 3.82 MIL/uL — ABNORMAL LOW (ref 3.87–5.11)

## 2012-03-28 LAB — SURGICAL PCR SCREEN: MRSA, PCR: NEGATIVE

## 2012-03-28 NOTE — Patient Instructions (Addendum)
   Your procedure is scheduled on: Tuesday August 27th  Enter through the Main Entrance of [facility] at: 10:45am Pick up the phone at the desk and dial [phone] and inform us of your arrival.  Please call this number if you have any problems the morning of surgery: [phone]  Remember: Do not eat food after midnight: Monday Do not drink clear liquids after:8:15am on Tuesday  Do not wear jewelry, make-up, or FINGER nail polish No metal in your hair or on your body. Do not wear lotions, powders, perfumes or deodorant. Do not shave 48 hours prior to surgery. Do not bring valuables to the hospital. Leave suitcase in the car. After Surgery it may be brought to your room. For patients being admitted to the hospital, checkout time is 11:00am the day of discharge.     Remember to use your hibiclens as instructed.Please shower with 1/2 bottle the evening before your surgery and the other 1/2 bottle the morning of surgery. Neck down avoiding private area.

## 2012-04-02 MED ORDER — DEXTROSE 5 % IV SOLN
3.0000 g | INTRAVENOUS | Status: AC
  Administered 2012-04-03: 3 g via INTRAVENOUS
  Filled 2012-04-02: qty 3000

## 2012-04-03 ENCOUNTER — Encounter (HOSPITAL_COMMUNITY): Payer: Self-pay | Admitting: *Deleted

## 2012-04-03 ENCOUNTER — Encounter (HOSPITAL_COMMUNITY)
Admission: RE | Disposition: A | Discharge status: Home / Self Care | Payer: Self-pay | Source: Ambulatory Visit | Attending: Obstetrics & Gynecology

## 2012-04-03 ENCOUNTER — Inpatient Hospital Stay (HOSPITAL_COMMUNITY): Payer: Medicaid Other | Admitting: Anesthesiology

## 2012-04-03 ENCOUNTER — Encounter (HOSPITAL_COMMUNITY): Payer: Self-pay | Admitting: Anesthesiology

## 2012-04-03 ENCOUNTER — Inpatient Hospital Stay (HOSPITAL_COMMUNITY)
Admission: RE | Admit: 2012-04-03 | Discharge: 2012-04-06 | DRG: 766 | Disposition: A | Discharge status: Home / Self Care | Payer: Medicaid Other | Source: Ambulatory Visit | Attending: Obstetrics & Gynecology | Admitting: Obstetrics & Gynecology

## 2012-04-03 DIAGNOSIS — O34219 Maternal care for unspecified type scar from previous cesarean delivery: Principal | ICD-10-CM | POA: Diagnosis present

## 2012-04-03 DIAGNOSIS — O09529 Supervision of elderly multigravida, unspecified trimester: Secondary | ICD-10-CM | POA: Diagnosis present

## 2012-04-03 DIAGNOSIS — Z302 Encounter for sterilization: Secondary | ICD-10-CM

## 2012-04-03 LAB — TYPE AND SCREEN
ABO/RH(D): A POS
Antibody Screen: NEGATIVE

## 2012-04-03 SURGERY — Surgical Case
Anesthesia: Spinal | Site: Abdomen | Laterality: Bilateral | Wound class: Clean Contaminated

## 2012-04-03 MED ORDER — TETANUS-DIPHTH-ACELL PERTUSSIS 5-2.5-18.5 LF-MCG/0.5 IM SUSP
0.5000 mL | Freq: Once | INTRAMUSCULAR | Status: AC
  Administered 2012-04-04: 0.5 mL via INTRAMUSCULAR

## 2012-04-03 MED ORDER — MENTHOL 3 MG MT LOZG: 1.0000 | LOZENGE | OROMUCOSAL | Status: DC | PRN

## 2012-04-03 MED ORDER — ONDANSETRON HCL 4 MG PO TABS: 4.0000 mg | ORAL_TABLET | ORAL | Status: DC | PRN

## 2012-04-03 MED ORDER — MEPERIDINE HCL 25 MG/ML IJ SOLN: 6.2500 mg | INTRAMUSCULAR | Status: DC | PRN

## 2012-04-03 MED ORDER — DIPHENHYDRAMINE HCL 25 MG PO CAPS: 25.0000 mg | ORAL_CAPSULE | ORAL | Status: DC | PRN

## 2012-04-03 MED ORDER — MIDAZOLAM HCL 2 MG/2ML IJ SOLN: 0.5000 mg | Freq: Once | INTRAMUSCULAR | Status: DC | PRN

## 2012-04-03 MED ORDER — EPHEDRINE 5 MG/ML INJ
INTRAVENOUS | Status: AC
  Filled 2012-04-03: qty 10

## 2012-04-03 MED ORDER — SENNOSIDES-DOCUSATE SODIUM 8.6-50 MG PO TABS
2.0000 | ORAL_TABLET | Freq: Every day | ORAL | Status: DC
  Administered 2012-04-04 – 2012-04-05 (×2): 2 via ORAL

## 2012-04-03 MED ORDER — DIPHENHYDRAMINE HCL 50 MG/ML IJ SOLN: 25.0000 mg | INTRAMUSCULAR | Status: DC | PRN

## 2012-04-03 MED ORDER — NALBUPHINE SYRINGE 5 MG/0.5 ML
5.0000 mg | INJECTION | INTRAMUSCULAR | Status: DC | PRN
  Filled 2012-04-03 (×3): qty 1

## 2012-04-03 MED ORDER — ONDANSETRON HCL 4 MG/2ML IJ SOLN
INTRAMUSCULAR | Status: DC | PRN
  Administered 2012-04-03: 4 mg via INTRAVENOUS

## 2012-04-03 MED ORDER — FENTANYL CITRATE 0.05 MG/ML IJ SOLN
INTRAMUSCULAR | Status: DC | PRN
  Administered 2012-04-03: 25 ug via INTRATHECAL

## 2012-04-03 MED ORDER — DIPHENHYDRAMINE HCL 25 MG PO CAPS
25.0000 mg | ORAL_CAPSULE | ORAL | Status: DC | PRN
  Administered 2012-04-04 (×4): 25 mg via ORAL
  Filled 2012-04-03 (×4): qty 1

## 2012-04-03 MED ORDER — NALBUPHINE SYRINGE 5 MG/0.5 ML
5.0000 mg | INJECTION | INTRAMUSCULAR | Status: DC | PRN
  Administered 2012-04-03 – 2012-04-04 (×3): 10 mg via INTRAVENOUS
  Filled 2012-04-03 (×2): qty 1

## 2012-04-03 MED ORDER — SIMETHICONE 80 MG PO CHEW: 80.0000 mg | CHEWABLE_TABLET | ORAL | Status: DC | PRN

## 2012-04-03 MED ORDER — OXYTOCIN 10 UNIT/ML IJ SOLN
INTRAMUSCULAR | Status: AC
  Filled 2012-04-03: qty 4

## 2012-04-03 MED ORDER — MORPHINE SULFATE (PF) 0.5 MG/ML IJ SOLN
INTRAMUSCULAR | Status: DC | PRN
  Administered 2012-04-03: .1 mg via INTRATHECAL

## 2012-04-03 MED ORDER — LACTATED RINGERS IV SOLN: INTRAVENOUS | Status: DC

## 2012-04-03 MED ORDER — ZOLPIDEM TARTRATE 5 MG PO TABS: 5.0000 mg | ORAL_TABLET | Freq: Every evening | ORAL | Status: DC | PRN

## 2012-04-03 MED ORDER — DIPHENHYDRAMINE HCL 50 MG/ML IJ SOLN
INTRAMUSCULAR | Status: AC
  Administered 2012-04-03: 12.5 mg via INTRAVENOUS
  Filled 2012-04-03: qty 1

## 2012-04-03 MED ORDER — BUPIVACAINE IN DEXTROSE 0.75-8.25 % IT SOLN
INTRATHECAL | Status: DC | PRN
  Administered 2012-04-03: 1.65 mL via INTRATHECAL

## 2012-04-03 MED ORDER — KETOROLAC TROMETHAMINE 30 MG/ML IJ SOLN: 30.0000 mg | Freq: Four times a day (QID) | INTRAMUSCULAR | Status: DC | PRN

## 2012-04-03 MED ORDER — FENTANYL CITRATE 0.05 MG/ML IJ SOLN
INTRAMUSCULAR | Status: AC
  Filled 2012-04-03: qty 2

## 2012-04-03 MED ORDER — DIPHENHYDRAMINE HCL 25 MG PO CAPS
25.0000 mg | ORAL_CAPSULE | Freq: Four times a day (QID) | ORAL | Status: DC | PRN
  Filled 2012-04-03: qty 1

## 2012-04-03 MED ORDER — OXYCODONE-ACETAMINOPHEN 5-325 MG PO TABS
1.0000 | ORAL_TABLET | ORAL | Status: DC | PRN
  Administered 2012-04-04 – 2012-04-06 (×14): 2 via ORAL
  Filled 2012-04-03 (×14): qty 2

## 2012-04-03 MED ORDER — ONDANSETRON HCL 4 MG/2ML IJ SOLN: 4.0000 mg | Freq: Three times a day (TID) | INTRAMUSCULAR | Status: DC | PRN

## 2012-04-03 MED ORDER — FENTANYL CITRATE 0.05 MG/ML IJ SOLN
INTRAMUSCULAR | Status: AC
  Administered 2012-04-03: 50 ug via INTRAVENOUS
  Filled 2012-04-03: qty 2

## 2012-04-03 MED ORDER — PNEUMOCOCCAL VAC POLYVALENT 25 MCG/0.5ML IJ INJ
0.5000 mL | INJECTION | INTRAMUSCULAR | Status: AC
  Administered 2012-04-04: 0.5 mL via INTRAMUSCULAR
  Filled 2012-04-03: qty 0.5

## 2012-04-03 MED ORDER — DIPHENHYDRAMINE HCL 50 MG/ML IJ SOLN
12.5000 mg | INTRAMUSCULAR | Status: DC | PRN
  Administered 2012-04-03 – 2012-04-04 (×2): 12.5 mg via INTRAVENOUS
  Filled 2012-04-03: qty 1

## 2012-04-03 MED ORDER — SODIUM CHLORIDE 0.9 % IJ SOLN: 9.0000 mL | INTRAMUSCULAR | Status: DC | PRN

## 2012-04-03 MED ORDER — SCOPOLAMINE 1 MG/3DAYS TD PT72: 1.0000 | MEDICATED_PATCH | Freq: Once | TRANSDERMAL | Status: DC

## 2012-04-03 MED ORDER — SODIUM CHLORIDE 0.9 % IJ SOLN: 3.0000 mL | INTRAMUSCULAR | Status: DC | PRN

## 2012-04-03 MED ORDER — LACTATED RINGERS IV SOLN
Freq: Once | INTRAVENOUS | Status: AC
  Administered 2012-04-03 (×5): via INTRAVENOUS

## 2012-04-03 MED ORDER — HYDROMORPHONE 0.3 MG/ML IV SOLN
INTRAVENOUS | Status: DC
  Administered 2012-04-03: 0.3 mL via INTRAVENOUS
  Administered 2012-04-04: 04:00:00 via INTRAVENOUS
  Administered 2012-04-04: 2.59 mg via INTRAVENOUS
  Administered 2012-04-04: 2.69 mg via INTRAVENOUS
  Administered 2012-04-04: 2.39 mg via INTRAVENOUS
  Filled 2012-04-03 (×2): qty 25

## 2012-04-03 MED ORDER — KETOROLAC TROMETHAMINE 30 MG/ML IJ SOLN
30.0000 mg | Freq: Four times a day (QID) | INTRAMUSCULAR | Status: DC | PRN
  Administered 2012-04-03: 30 mg via INTRAMUSCULAR

## 2012-04-03 MED ORDER — METOCLOPRAMIDE HCL 5 MG/ML IJ SOLN
10.0000 mg | Freq: Three times a day (TID) | INTRAMUSCULAR | Status: DC | PRN
  Administered 2012-04-03: 10 mg via INTRAVENOUS

## 2012-04-03 MED ORDER — SIMETHICONE 80 MG PO CHEW
80.0000 mg | CHEWABLE_TABLET | Freq: Three times a day (TID) | ORAL | Status: DC
  Administered 2012-04-04 – 2012-04-06 (×8): 80 mg via ORAL

## 2012-04-03 MED ORDER — FENTANYL CITRATE 0.05 MG/ML IJ SOLN
25.0000 ug | INTRAMUSCULAR | Status: DC | PRN
  Administered 2012-04-03 (×2): 50 ug via INTRAVENOUS

## 2012-04-03 MED ORDER — NALBUPHINE HCL 10 MG/ML IJ SOLN: 5.0000 mg | INTRAMUSCULAR | Status: DC | PRN

## 2012-04-03 MED ORDER — ACETAMINOPHEN 10 MG/ML IV SOLN
1000.0000 mg | Freq: Four times a day (QID) | INTRAVENOUS | Status: AC | PRN
  Administered 2012-04-03: 1000 mg via INTRAVENOUS
  Filled 2012-04-03 (×2): qty 100

## 2012-04-03 MED ORDER — MORPHINE SULFATE 0.5 MG/ML IJ SOLN
INTRAMUSCULAR | Status: AC
  Filled 2012-04-03: qty 10

## 2012-04-03 MED ORDER — KETOROLAC TROMETHAMINE 30 MG/ML IJ SOLN
30.0000 mg | Freq: Four times a day (QID) | INTRAMUSCULAR | Status: AC | PRN
  Filled 2012-04-03: qty 1

## 2012-04-03 MED ORDER — NALOXONE HCL 0.4 MG/ML IJ SOLN: 0.4000 mg | INTRAMUSCULAR | Status: DC | PRN

## 2012-04-03 MED ORDER — METOCLOPRAMIDE HCL 5 MG/ML IJ SOLN: 10.0000 mg | Freq: Three times a day (TID) | INTRAMUSCULAR | Status: DC | PRN

## 2012-04-03 MED ORDER — SCOPOLAMINE 1 MG/3DAYS TD PT72
1.0000 | MEDICATED_PATCH | Freq: Once | TRANSDERMAL | Status: DC
  Filled 2012-04-03: qty 1

## 2012-04-03 MED ORDER — LANOLIN HYDROUS EX OINT: 1.0000 "application " | TOPICAL_OINTMENT | CUTANEOUS | Status: DC | PRN

## 2012-04-03 MED ORDER — EPHEDRINE SULFATE 50 MG/ML IJ SOLN
INTRAMUSCULAR | Status: DC | PRN
  Administered 2012-04-03 (×3): 5 mg via INTRAVENOUS
  Administered 2012-04-03: 10 mg via INTRAVENOUS

## 2012-04-03 MED ORDER — OXYTOCIN 10 UNIT/ML IJ SOLN
40.0000 [IU] | INTRAVENOUS | Status: DC | PRN
  Administered 2012-04-03: 40 [IU] via INTRAVENOUS

## 2012-04-03 MED ORDER — SCOPOLAMINE 1 MG/3DAYS TD PT72
MEDICATED_PATCH | TRANSDERMAL | Status: AC
  Administered 2012-04-03: 1.5 mg via TRANSDERMAL
  Filled 2012-04-03: qty 1

## 2012-04-03 MED ORDER — SODIUM CHLORIDE 0.9 % IV SOLN
1.0000 ug/kg/h | INTRAVENOUS | Status: DC | PRN
  Filled 2012-04-03: qty 2.5

## 2012-04-03 MED ORDER — DIBUCAINE 1 % RE OINT: 1.0000 "application " | TOPICAL_OINTMENT | RECTAL | Status: DC | PRN

## 2012-04-03 MED ORDER — ACETAMINOPHEN 10 MG/ML IV SOLN: 1000.0000 mg | Freq: Four times a day (QID) | INTRAVENOUS | Status: DC | PRN

## 2012-04-03 MED ORDER — PRENATAL MULTIVITAMIN CH
1.0000 | ORAL_TABLET | Freq: Every day | ORAL | Status: DC
  Administered 2012-04-04 – 2012-04-06 (×3): 1 via ORAL
  Filled 2012-04-03 (×3): qty 1

## 2012-04-03 MED ORDER — ONDANSETRON HCL 4 MG/2ML IJ SOLN
INTRAMUSCULAR | Status: AC
  Filled 2012-04-03: qty 2

## 2012-04-03 MED ORDER — SODIUM CHLORIDE 0.9 % IV SOLN: 1.0000 ug/kg/h | INTRAVENOUS | Status: DC | PRN

## 2012-04-03 MED ORDER — ONDANSETRON HCL 4 MG/2ML IJ SOLN: 4.0000 mg | INTRAMUSCULAR | Status: DC | PRN

## 2012-04-03 MED ORDER — DIPHENHYDRAMINE HCL 50 MG/ML IJ SOLN: 12.5000 mg | INTRAMUSCULAR | Status: DC | PRN

## 2012-04-03 MED ORDER — SCOPOLAMINE 1 MG/3DAYS TD PT72
1.0000 | MEDICATED_PATCH | Freq: Once | TRANSDERMAL | Status: DC
  Administered 2012-04-03: 1.5 mg via TRANSDERMAL

## 2012-04-03 MED ORDER — OXYTOCIN 40 UNITS IN LACTATED RINGERS INFUSION - SIMPLE MED: 62.5000 mL/h | INTRAVENOUS | Status: AC

## 2012-04-03 MED ORDER — IBUPROFEN 600 MG PO TABS
600.0000 mg | ORAL_TABLET | Freq: Four times a day (QID) | ORAL | Status: DC
  Administered 2012-04-04 – 2012-04-06 (×9): 600 mg via ORAL
  Filled 2012-04-03 (×10): qty 1

## 2012-04-03 MED ORDER — METOCLOPRAMIDE HCL 5 MG/ML IJ SOLN
INTRAMUSCULAR | Status: AC
  Administered 2012-04-03: 10 mg via INTRAVENOUS
  Filled 2012-04-03: qty 2

## 2012-04-03 MED ORDER — PROMETHAZINE HCL 25 MG/ML IJ SOLN: 6.2500 mg | INTRAMUSCULAR | Status: DC | PRN

## 2012-04-03 MED ORDER — KETOROLAC TROMETHAMINE 30 MG/ML IJ SOLN
INTRAMUSCULAR | Status: AC
  Administered 2012-04-03: 30 mg via INTRAMUSCULAR
  Filled 2012-04-03: qty 1

## 2012-04-03 MED ORDER — LACTATED RINGERS IV SOLN
INTRAVENOUS | Status: DC
  Administered 2012-04-04: 03:00:00 via INTRAVENOUS

## 2012-04-03 MED ORDER — WITCH HAZEL-GLYCERIN EX PADS: 1.0000 "application " | MEDICATED_PAD | CUTANEOUS | Status: DC | PRN

## 2012-04-03 SURGICAL SUPPLY — 32 items
CLOTH BEACON ORANGE TIMEOUT ST (SAFETY) ×2 IMPLANT
CONTAINER PREFILL 10% NBF 15ML (MISCELLANEOUS) IMPLANT
DRSG COVADERM 4X10 (GAUZE/BANDAGES/DRESSINGS) ×1 IMPLANT
ELECT REM PT RETURN 9FT ADLT (ELECTROSURGICAL) ×2
ELECTRODE REM PT RTRN 9FT ADLT (ELECTROSURGICAL) ×1 IMPLANT
EXTRACTOR VACUUM M CUP 4 TUBE (SUCTIONS) IMPLANT
GLOVE ECLIPSE 6.0 STRL STRAW (GLOVE) ×2 IMPLANT
GLOVE ECLIPSE 6.5 STRL STRAW (GLOVE) ×2 IMPLANT
GOWN PREVENTION PLUS LG XLONG (DISPOSABLE) ×6 IMPLANT
KIT ABG SYR 3ML LUER SLIP (SYRINGE) IMPLANT
NDL HYPO 25X5/8 SAFETYGLIDE (NEEDLE) ×1 IMPLANT
NEEDLE HYPO 25X5/8 SAFETYGLIDE (NEEDLE) ×2 IMPLANT
NS IRRIG 1000ML POUR BTL (IV SOLUTION) ×2 IMPLANT
PACK C SECTION WH (CUSTOM PROCEDURE TRAY) ×2 IMPLANT
PAD OB MATERNITY 4.3X12.25 (PERSONAL CARE ITEMS) ×1 IMPLANT
RTRCTR C-SECT PINK 25CM LRG (MISCELLANEOUS) ×2 IMPLANT
SLEEVE SCD COMPRESS KNEE MED (MISCELLANEOUS) IMPLANT
STAPLER VISISTAT 35W (STAPLE) ×1 IMPLANT
SUT PLAIN 0 NONE (SUTURE) IMPLANT
SUT VIC AB 0 CT1 27 (SUTURE) ×6
SUT VIC AB 0 CT1 27XBRD ANBCTR (SUTURE) ×3 IMPLANT
SUT VIC AB 1 CTX 36 (SUTURE) ×4
SUT VIC AB 1 CTX36XBRD ANBCTRL (SUTURE) ×2 IMPLANT
SUT VIC AB 3-0 CT1 27 (SUTURE) ×2
SUT VIC AB 3-0 CT1 TAPERPNT 27 (SUTURE) ×1 IMPLANT
SUT VIC AB 3-0 PS2 18 (SUTURE)
SUT VIC AB 3-0 PS2 18XBRD (SUTURE) IMPLANT
SUT VIC AB 3-0 SH 27 (SUTURE)
SUT VIC AB 3-0 SH 27X BRD (SUTURE) IMPLANT
TOWEL OR 17X24 6PK STRL BLUE (TOWEL DISPOSABLE) ×4 IMPLANT
TRAY FOLEY CATH 14FR (SET/KITS/TRAYS/PACK) ×2 IMPLANT
WATER STERILE IRR 1000ML POUR (IV SOLUTION) ×1 IMPLANT

## 2012-04-03 NOTE — Anesthesia Procedure Notes (Signed)
Spinal  Patient location during procedure: OR Start time: 04/03/2012 12:38 PM Staffing Anesthesiologist: Brayton Caves R Performed by: anesthesiologist  Preanesthetic Checklist Completed: patient identified, site marked, surgical consent, pre-op evaluation, timeout performed, IV checked, risks and benefits discussed and monitors and equipment checked Spinal Block Patient position: sitting Prep: DuraPrep Patient monitoring: heart rate, cardiac monitor, continuous pulse ox and blood pressure Approach: midline Location: L3-4 Injection technique: single-shot Needle Needle type: Sprotte  Needle gauge: 24 G Needle length: 9 cm Assessment Sensory level: T4 Additional Notes Patient identified.  Risk benefits discussed including failed block, incomplete pain control, headache, nerve damage, paralysis, blood pressure changes, nausea, vomiting, reactions to medication both toxic or allergic, and postpartum back pain.  Patient expressed understanding and wished to proceed.  All questions were answered.  Sterile technique used throughout procedure.  CSF was clear.  No parasthesia or other complications.  Please see nursing notes for vital signs.

## 2012-04-03 NOTE — Consult Note (Signed)
Neonatology Note:   Attendance at C-section:    I was asked to attend this repeat C/S at term. The mother is a G3P2 A pos, GBS not found with irritable bowel, bipolar disorder, ADHD, history of post-partum depression and HSV. She smokes < 1 ppd. ROM at delivery, fluid clear. Infant vigorous with good spontaneous cry and tone. Needed no bulb suctioning. Ap 9/9. Lungs clear to ausc in DR. To CN to care of Pediatrician.   Deatra James, MD

## 2012-04-03 NOTE — Anesthesia Preprocedure Evaluation (Signed)
Anesthesia Evaluation  Patient identified by MRN, date of birth, ID band Patient awake    Reviewed: Allergy & Precautions, H&P , NPO status , Patient's Chart, lab work & pertinent test results  Airway Mallampati: III      Dental No notable dental hx.    Pulmonary neg pulmonary ROS,  breath sounds clear to auscultation  Pulmonary exam normal       Cardiovascular Exercise Tolerance: Good negative cardio ROS  Rhythm:regular Rate:Normal     Neuro/Psych PSYCHIATRIC DISORDERS negative neurological ROS  negative psych ROS   GI/Hepatic negative GI ROS, Neg liver ROS, GERD-  ,  Endo/Other  negative endocrine ROS  Renal/GU negative Renal ROS  negative genitourinary   Musculoskeletal   Abdominal Normal abdominal exam  (+)   Peds  Hematology negative hematology ROS (+)   Anesthesia Other Findings   Reproductive/Obstetrics (+) Pregnancy                           Anesthesia Physical Anesthesia Plan  ASA: II  Anesthesia Plan: Spinal   Post-op Pain Management:    Induction:   Airway Management Planned:   Additional Equipment:   Intra-op Plan:   Post-operative Plan:   Informed Consent: I have reviewed the patients History and Physical, chart, labs and discussed the procedure including the risks, benefits and alternatives for the proposed anesthesia with the patient or authorized representative who has indicated his/her understanding and acceptance.     Plan Discussed with: Anesthesiologist, CRNA and Surgeon  Anesthesia Plan Comments:         Anesthesia Quick Evaluation

## 2012-04-03 NOTE — Progress Notes (Signed)
Pt's IV line connection leaking after PCA had been running for 2 hrs. Line changed for PCA and re-primed with Hydromorphone. 5mg  with first prime and 5 mg with second prime checked by 2 nurses Kris Hartmann and Ebbie Ridge. Along with Tana Coast. Pharmacy notified of second line prime and instructed to write note.

## 2012-04-03 NOTE — H&P (Signed)
  36 y.o. G3P2003(twins)  Estimated Date of Delivery: 04/10/12 admitted at [redacted] weeks gestation repeat cesarean and BTL.  Prenatal Transfer Tool  Maternal Diabetes: No Genetic Screening: Normal Maternal Ultrasounds/Referrals: Normal Fetal Ultrasounds or other Referrals:  None Maternal Substance Abuse:  No Significant Maternal Medications:  None Significant Maternal Lab Results: None Other Significant Pregnancy Complications:  None  Afebrile, VSS Heart and Lungs: No active disease Abdomen: soft, gravid, EFW AGA. Cervical exam:  Deferred  Impression: Previous cesarean.  Desires repeat and sterilization  Plan:  Repeat C/S and Tubal Sterilization

## 2012-04-03 NOTE — Transfer of Care (Signed)
Immediate Anesthesia Transfer of Care Note  Patient: Christina [name]  Procedure(s) Performed: Procedure(s) (LRB): CESAREAN SECTION WITH BILATERAL TUBAL LIGATION (Bilateral)  Patient Location: PACU  Anesthesia Type: Spinal  Level of Consciousness: awake, alert  and oriented  Airway & Oxygen Therapy: Patient Spontanous Breathing  Post-op Assessment: Report given to PACU RN and Post -op Vital signs reviewed and stable  Post vital signs: stable  Complications: No apparent anesthesia complications

## 2012-04-03 NOTE — Anesthesia Postprocedure Evaluation (Signed)
Anesthesia Post Note  Patient: Christina [name]  Procedure(s) Performed: Procedure(s) (LRB): CESAREAN SECTION WITH BILATERAL TUBAL LIGATION (Bilateral)  Anesthesia type: Spinal  Patient location: PACU  Post pain: Pain level controlled  Post assessment: Post-op Vital signs reviewed  Last Vitals:  Filed Vitals:   04/03/12 1345  BP: 114/59  Pulse: 64  Temp: 36.3 C  Resp: 16    Post vital signs: Reviewed  Level of consciousness: awake  Complications: No apparent anesthesia complications

## 2012-04-03 NOTE — Op Note (Addendum)
Patient Name: Christina [name] MRN: [id number]  Date of Surgery: 04/03/2012    PREOPERATIVE DIAGNOSIS: REPEAT DESIRES STERILIZATION  POSTOPERATIVE DIAGNOSIS: REPEAT DESIRES STERILIZATION   PROCEDURE: Low transverse cesarean section, Tubal sterilization  SURGEON: Caralyn Guile. Arlyce Dice M.D.  ASSISTANT: Philip Aspen, D.O.  ANESTHESIA: Spinal  ESTIMATED BLOOD LOSS: 800 ml  FINDINGS: Female, Weight 8 lbs. 11 oz; Apgars 9,9; clear amniotic fluid; normal adnexa.  INDICATIONS: Previous cesarean x 2.  Desires sterilization.  PROCEDURE IN DETAIL: The patient was taken to the operating room and spinal anesthesia was placed.  She was then placed in the supine position with left lateral displacement of the uterus. The abdomen was prepped and draped in a sterile fashion and the bladder was catheterized.  A low transverse abdominal incision was made and carried down to the fascia. The fascia was opened transversely and the rectus sheath was dissected from the underlying rectus muscle. The rectus midline was identified and opened by sharp and blunt dissection. The peritoneum was opened. An Alexis retractor was placed and the lower uterine segment was identified, entered transversely by careful sharp dissection, and extended bluntly.  The infant was delivered without difficulty. The placenta was sent for cord blood collection. The uterus was bluntly curettage. The lower segment was closed with running interlocking Vicryl 1 suture.  Hemostasis was obtained with vertical mattress sutures. The left fallopian tube was identified and elevated with a Babcock clamp.  A Filshie clip was applied to occlude the tube.  The identical procedure was carried out on the right tube. The peritoneum and rectus muscle were closed in the midline with running 3-0 Vicryl suture. The fascia was closed with running 0 Vicryl suture and the skin was closed with staples. All sponge and instrument counts were correct.  The patient  tolerated the procedure well and left the operating room in good condition.

## 2012-04-04 LAB — CBC
HCT: 30.7 % — ABNORMAL LOW (ref 36.0–46.0)
Hemoglobin: 10.5 g/dL — ABNORMAL LOW (ref 12.0–15.0)
MCHC: 34.2 g/dL (ref 30.0–36.0)

## 2012-04-04 MED ORDER — VALACYCLOVIR HCL 500 MG PO TABS
500.0000 mg | ORAL_TABLET | Freq: Every day | ORAL | Status: DC
  Administered 2012-04-04 – 2012-04-06 (×3): 500 mg via ORAL
  Filled 2012-04-04 (×4): qty 1

## 2012-04-04 NOTE — Progress Notes (Signed)
Post Op Day 1 Subjective: no complaints  Objective: Blood pressure 103/69, pulse 71, temperature 98.3 F (36.8 C), temperature source Oral, resp. rate 18, height 6' (1.829 m), weight 118.842 kg (262 lb), last menstrual period 07/05/2011, SpO2 97.00%, unknown if currently breastfeeding.  Physical Exam:  General: alert Lochia: appropriate Uterine Fundus: firm Incision: no significant drainage   Basename 04/04/12 0540  HGB 10.5*  HCT 30.7*    Assessment/Plan: Plan for discharge tomorrow if patient desires.  Will circ baby when hospital payment made.   LOS: 1 day   Mickel Baas 04/04/2012, 9:46 AM

## 2012-04-04 NOTE — Anesthesia Postprocedure Evaluation (Signed)
Anesthesia Post Note  Patient: Christina [name]  Procedure(s) Performed: Procedure(s) (LRB): CESAREAN SECTION WITH BILATERAL TUBAL LIGATION (Bilateral)  Anesthesia type: SAB  Patient location: Mother/Baby  Post pain: Pain level controlled  Post assessment: Post-op Vital signs reviewed  Last Vitals:  Filed Vitals:   04/04/12 0657  BP:   Pulse:   Temp:   Resp: 16    Post vital signs: Reviewed  Level of consciousness: awake  Complications: No apparent anesthesia complications

## 2012-04-04 NOTE — Progress Notes (Signed)
UR chart review completed.  

## 2012-04-05 NOTE — Progress Notes (Signed)
Subjective: Postpartum Day 2: Cesarean Delivery Patient reports incisional pain, tolerating PO, + flatus and no problems voiding.    Objective: Vital signs in last 24 hours: Temp:  [97.4 F (36.3 C)-97.8 F (36.6 C)] 97.5 F (36.4 C) (08/29 0519) Pulse Rate:  [69-80] 69  (08/29 0519) Resp:  [16-18] 18  (08/29 0519) BP: (101-113)/(66-71) 113/67 mmHg (08/29 0519)  Physical Exam:  General: alert, cooperative and appears stated age Lochia: appropriate Uterine Fundus: firm Incision: healing well   Basename 04/04/12 0540  HGB 10.5*  HCT 30.7*    Assessment/Plan: Status post Cesarean section. Doing well postoperatively.  Continue current care. Anticipate d/c tomorrow  Christina [name] H. 04/05/2012, 10:02 AM

## 2012-04-05 NOTE — Clinical Social Work Psychosocial (Signed)
    Clinical Social Work Department BRIEF PSYCHOSOCIAL ASSESSMENT 04/05/2012  Patient:  Christina [name], Christina [name]     Account Number:  [id number]     Admit date:  04/03/2012  Clinical Social Worker:  Andy Gauss  Date/Time:  04/05/2012 04:33 PM  Referred by:  Physician  Date Referred:  04/05/2012 Referred for  Behavioral Health Issues   Other Referral:   Interview type:  Patient Other interview type:    PSYCHOSOCIAL DATA Living Status:  FAMILY Admitted from facility:   Level of care:   Primary support name:  Margrett Rud Primary support relationship to patient:  FRIEND Degree of support available:   Involved    CURRENT CONCERNS Current Concerns  Behavioral Health Issues   Other Concerns:    SOCIAL WORK ASSESSMENT / PLAN Pt was seen by this Sw to assess history of bipolar disorder & PP depression.  Pt told Sw that she was diagnosed with bipolar disorder, 3-4 years ago.  She was prescribed medication, which caused her to become "manic."  Pt told Sw that she was depressed and gained a lot of weight.    Once pt's medications were changed, she states she coped a lot better. Her illnesses were being treated by Dr. Jeannetta Nap.  She took the medication for 2 years before learning of pregnancy. Upon pregnancy confirmation, she stopped taking the medication and was not able to cope well.  Pt felt like she was on a "roller coaster off med's."  Eventually, her symptoms lessened.  Currently, she reports feeling irritated, with all the staff/visitors coming in her room. She denies depression at this time or SI history.  Pt sees a therapist, Ian Malkin, once a week and plans to continue upon discharge.  Pt does not want to restart medication at this time but will if symptoms become unmanageable.  She admits to feeling depressed after the birth of her youngest daughter but attributes the source to an uninvolved FOB.  Pt was prescribed oxycodone during the pregnancy for back, neck and shoulder pain, of  which she took PRN.  She denied regular use to avoid the risk of withdrawal symptoms to the infant.  Pt lives with her parents.  She has applied for disability.  FOB is 36 years old and involved, as per pt.  She has supplies for the infant.  Sw encouraged pt to continue therapy & restart medication if necessary.   Assessment/plan status:  No Further Intervention Required Other assessment/ plan:   Information/referral to community resources:    PATIENT'S/FAMILY'S RESPONSE TO PLAN OF CARE:  Pt thanked Sw for consult.

## 2012-04-06 MED ORDER — OXYCODONE-ACETAMINOPHEN 5-325 MG PO TABS: 1.0000 | ORAL_TABLET | ORAL | Status: AC | PRN

## 2012-04-06 NOTE — Discharge Summary (Signed)
Obstetric Discharge Summary Reason for Admission: cesarean section Prenatal Procedures: none Intrapartum Procedures: cesarean: low cervical, transverse, BTL Postpartum Procedures: none Complications-Operative and Postpartum: none Hemoglobin  Date Value Range Status  04/04/2012 10.5* 12.0 - 15.0 g/dL Final     HCT  Date Value Range Status  04/04/2012 30.7* 36.0 - 46.0 % Final    Discharge Diagnoses: Term Pregnancy-delivered  Discharge Information: Date: 04/06/2012 Activity: pelvic rest Diet: routine Medications: Percocet Condition: stable Instructions: refer to practice specific booklet Discharge to: home Follow-up Information    Schedule an appointment as soon as possible for a visit with Mickel Baas, MD.   Contact information:   [address] [place] [address] [phone]          Newborn Data: Live born female  Birth Weight: 8 lb 11.2 oz (3945 g) APGAR: 9, 9  Home with mother.  Nyja Westbrook A 04/06/2012, 8:19 AM

## 2012-04-06 NOTE — Progress Notes (Signed)
  Patient is eating, ambulating, voiding.  Pain control is good.  Filed Vitals:   04/05/12 0519 04/05/12 1440 04/05/12 2200 04/06/12 0545  BP: 113/67 112/74 131/79 129/68  Pulse: 69 64 91 83  Temp: 97.5 F (36.4 C) 97.7 F (36.5 C) 97.4 F (36.3 C) 97.5 F (36.4 C)  TempSrc: Oral Oral Oral Oral  Resp: 18 18 18 18   Height:      Weight:      SpO2:        lungs:   clear to auscultation cor:    RRR Abdomen:  soft, appropriate tenderness, incisions intact and without erythema or exudate ex:    no cords   Lab Results  Component Value Date   WBC 15.2* 04/04/2012   HGB 10.5* 04/04/2012   HCT 30.7* 04/04/2012   MCV 101.3* 04/04/2012   PLT 184 04/04/2012    --/--/A POS (08/27 1054)/RI  A/P    Post operative day 3.  Routine post op and postpartum care.  Expect d/c today.  Percocet for pain control.

## 2012-06-29 ENCOUNTER — Encounter (HOSPITAL_COMMUNITY): Payer: Self-pay | Admitting: *Deleted

## 2012-06-29 ENCOUNTER — Inpatient Hospital Stay (HOSPITAL_COMMUNITY)
Admission: AD | Admit: 2012-06-29 | Discharge: 2012-07-03 | DRG: 690 | Disposition: A | Discharge status: Home / Self Care | Payer: Medicaid Other | Source: Ambulatory Visit | Attending: Family Medicine | Admitting: Family Medicine

## 2012-06-29 ENCOUNTER — Inpatient Hospital Stay (HOSPITAL_COMMUNITY): Payer: Medicaid Other

## 2012-06-29 DIAGNOSIS — E876 Hypokalemia: Secondary | ICD-10-CM | POA: Diagnosis present

## 2012-06-29 DIAGNOSIS — B009 Herpesviral infection, unspecified: Secondary | ICD-10-CM | POA: Diagnosis present

## 2012-06-29 DIAGNOSIS — F411 Generalized anxiety disorder: Secondary | ICD-10-CM | POA: Diagnosis present

## 2012-06-29 DIAGNOSIS — K219 Gastro-esophageal reflux disease without esophagitis: Secondary | ICD-10-CM | POA: Diagnosis present

## 2012-06-29 DIAGNOSIS — F17200 Nicotine dependence, unspecified, uncomplicated: Secondary | ICD-10-CM | POA: Diagnosis present

## 2012-06-29 DIAGNOSIS — N10 Acute tubulo-interstitial nephritis: Principal | ICD-10-CM | POA: Diagnosis present

## 2012-06-29 DIAGNOSIS — R10A1 Flank pain, right side: Secondary | ICD-10-CM | POA: Diagnosis present

## 2012-06-29 DIAGNOSIS — Z79899 Other long term (current) drug therapy: Secondary | ICD-10-CM

## 2012-06-29 DIAGNOSIS — Z720 Tobacco use: Secondary | ICD-10-CM | POA: Diagnosis present

## 2012-06-29 DIAGNOSIS — A498 Other bacterial infections of unspecified site: Secondary | ICD-10-CM | POA: Diagnosis present

## 2012-06-29 DIAGNOSIS — F319 Bipolar disorder, unspecified: Secondary | ICD-10-CM | POA: Diagnosis present

## 2012-06-29 DIAGNOSIS — R109 Unspecified abdominal pain: Secondary | ICD-10-CM

## 2012-06-29 DIAGNOSIS — N12 Tubulo-interstitial nephritis, not specified as acute or chronic: Secondary | ICD-10-CM | POA: Diagnosis present

## 2012-06-29 DIAGNOSIS — K589 Irritable bowel syndrome without diarrhea: Secondary | ICD-10-CM

## 2012-06-29 HISTORY — DX: Herpesviral infection, unspecified: B00.9

## 2012-06-29 LAB — URINALYSIS, ROUTINE W REFLEX MICROSCOPIC
Bilirubin Urine: NEGATIVE
Glucose, UA: NEGATIVE mg/dL
Ketones, ur: NEGATIVE mg/dL
Nitrite: NEGATIVE
Specific Gravity, Urine: <1.005 — ABNORMAL LOW (ref 1.005–1.030)
pH: 6 (ref 5.0–8.0)

## 2012-06-29 LAB — CBC WITH DIFFERENTIAL/PLATELET
Eosinophils Absolute: 0.1 10*3/uL (ref 0.0–0.7)
Eosinophils Relative: 1 % (ref 0–5)
Hemoglobin: 11.1 g/dL — ABNORMAL LOW (ref 12.0–15.0)
Lymphocytes Relative: 14 % (ref 12–46)
Lymphs Abs: 2.7 10*3/uL (ref 0.7–4.0)
MCH: 34.7 pg — ABNORMAL HIGH (ref 26.0–34.0)
MCV: 100.9 fL — ABNORMAL HIGH (ref 78.0–100.0)
Monocytes Relative: 9 % (ref 3–12)
Platelets: 221 10*3/uL (ref 150–400)
RBC: 3.2 MIL/uL — ABNORMAL LOW (ref 3.87–5.11)
WBC: 18.7 10*3/uL — ABNORMAL HIGH (ref 4.0–10.5)

## 2012-06-29 LAB — WET PREP, GENITAL
Trich, Wet Prep: NONE SEEN
WBC, Wet Prep HPF POC: NONE SEEN
Yeast Wet Prep HPF POC: NONE SEEN

## 2012-06-29 LAB — URINE MICROSCOPIC-ADD ON

## 2012-06-29 MED ORDER — KETOROLAC TROMETHAMINE 60 MG/2ML IM SOLN
60.0000 mg | Freq: Once | INTRAMUSCULAR | Status: AC
  Administered 2012-06-29: 60 mg via INTRAMUSCULAR
  Filled 2012-06-29: qty 2

## 2012-06-29 MED ORDER — CYCLOBENZAPRINE HCL 10 MG PO TABS
10.0000 mg | ORAL_TABLET | Freq: Once | ORAL | Status: AC
  Administered 2012-06-29: 10 mg via ORAL
  Filled 2012-06-29: qty 1

## 2012-06-29 NOTE — MAU Note (Signed)
Had c/s 04/03/12 with tubal lig. Missed my followup appt at 4wks. Rescheduled me and missed that appt so never had a reck after c/s. I bled a lot after c/s. I'm taking birth control pills to regulate my periods. Starting last Sat. Started having pain in lower back. Couldn't move all day Sat and Sun. Also some pain R lower abd. Monday was better. Tues started having lower back pain again and is not stopping.

## 2012-06-29 NOTE — MAU Note (Addendum)
Patient up to bathroom to void, explained waiting for ultrasound to call for her will be taken down in wheelchair. When asked about her pain level patient states is able to lay down better, but then states pain is not better after medications.

## 2012-06-29 NOTE — MAU Note (Signed)
C/o intermittent back since Saturday and abdominal pain since Tuesday;  Tried advil, ibuprofen and hydrocodone with no relief;

## 2012-06-30 ENCOUNTER — Inpatient Hospital Stay (HOSPITAL_COMMUNITY): Payer: Medicaid Other

## 2012-06-30 ENCOUNTER — Encounter (HOSPITAL_COMMUNITY): Payer: Self-pay

## 2012-06-30 DIAGNOSIS — Z720 Tobacco use: Secondary | ICD-10-CM | POA: Diagnosis present

## 2012-06-30 DIAGNOSIS — F17200 Nicotine dependence, unspecified, uncomplicated: Secondary | ICD-10-CM

## 2012-06-30 DIAGNOSIS — R1031 Right lower quadrant pain: Secondary | ICD-10-CM

## 2012-06-30 DIAGNOSIS — N12 Tubulo-interstitial nephritis, not specified as acute or chronic: Secondary | ICD-10-CM | POA: Diagnosis present

## 2012-06-30 DIAGNOSIS — K589 Irritable bowel syndrome without diarrhea: Secondary | ICD-10-CM

## 2012-06-30 DIAGNOSIS — K219 Gastro-esophageal reflux disease without esophagitis: Secondary | ICD-10-CM | POA: Insufficient documentation

## 2012-06-30 DIAGNOSIS — R10A1 Flank pain, right side: Secondary | ICD-10-CM | POA: Diagnosis present

## 2012-06-30 DIAGNOSIS — R109 Unspecified abdominal pain: Secondary | ICD-10-CM | POA: Diagnosis present

## 2012-06-30 DIAGNOSIS — F319 Bipolar disorder, unspecified: Secondary | ICD-10-CM | POA: Diagnosis present

## 2012-06-30 HISTORY — DX: Tubulo-interstitial nephritis, not specified as acute or chronic: N12

## 2012-06-30 LAB — CBC
HCT: 35.1 % — ABNORMAL LOW (ref 36.0–46.0)
Hemoglobin: 12.1 g/dL (ref 12.0–15.0)
MCV: 99.4 fL (ref 78.0–100.0)
WBC: 19.7 10*3/uL — ABNORMAL HIGH (ref 4.0–10.5)

## 2012-06-30 LAB — COMPREHENSIVE METABOLIC PANEL
AST: 16 U/L (ref 0–37)
BUN: 8 mg/dL (ref 6–23)
CO2: 29 mEq/L (ref 19–32)
Chloride: 98 mEq/L (ref 96–112)
Creatinine, Ser: 0.62 mg/dL (ref 0.50–1.10)
GFR calc Af Amer: >90 mL/min (ref >90)
GFR calc non Af Amer: >90 mL/min (ref >90)
Glucose, Bld: 93 mg/dL (ref 70–99)
Total Bilirubin: 0.2 mg/dL — ABNORMAL LOW (ref 0.3–1.2)

## 2012-06-30 LAB — D-DIMER, QUANTITATIVE: D-Dimer, Quant: <0.27 ug/mL-FEU (ref 0.00–0.48)

## 2012-06-30 LAB — GC/CHLAMYDIA PROBE AMP, GENITAL: GC Probe Amp, Genital: NEGATIVE

## 2012-06-30 MED ORDER — HYDROMORPHONE HCL PF 1 MG/ML IJ SOLN
1.0000 mg | Freq: Once | INTRAMUSCULAR | Status: AC
  Administered 2012-06-30: 1 mg via INTRAVENOUS
  Filled 2012-06-30: qty 1

## 2012-06-30 MED ORDER — MORPHINE SULFATE 4 MG/ML IJ SOLN
4.0000 mg | Freq: Once | INTRAMUSCULAR | Status: AC
  Administered 2012-06-30: 4 mg via INTRAVENOUS
  Filled 2012-06-30: qty 1

## 2012-06-30 MED ORDER — PANTOPRAZOLE SODIUM 40 MG PO TBEC
40.0000 mg | DELAYED_RELEASE_TABLET | Freq: Every day | ORAL | Status: DC
  Administered 2012-07-01 – 2012-07-03 (×3): 40 mg via ORAL
  Filled 2012-06-30 (×4): qty 1

## 2012-06-30 MED ORDER — VALACYCLOVIR HCL 500 MG PO TABS
500.0000 mg | ORAL_TABLET | Freq: Every day | ORAL | Status: DC
  Administered 2012-06-30 – 2012-07-03 (×4): 500 mg via ORAL
  Filled 2012-06-30 (×4): qty 1

## 2012-06-30 MED ORDER — SODIUM CHLORIDE 0.9 % IV SOLN: 250.0000 mL | INTRAVENOUS | Status: DC | PRN

## 2012-06-30 MED ORDER — ENOXAPARIN SODIUM 60 MG/0.6ML ~~LOC~~ SOLN
50.0000 mg | SUBCUTANEOUS | Status: DC
  Administered 2012-07-02: 50 mg via SUBCUTANEOUS
  Filled 2012-06-30 (×4): qty 0.6

## 2012-06-30 MED ORDER — ENOXAPARIN SODIUM 40 MG/0.4ML ~~LOC~~ SOLN: 40.0000 mg | SUBCUTANEOUS | Status: DC

## 2012-06-30 MED ORDER — SODIUM CHLORIDE 0.9 % IV SOLN
500.0000 mg | Freq: Once | INTRAVENOUS | Status: AC
  Administered 2012-06-30: 500 mg via INTRAVENOUS
  Filled 2012-06-30: qty 500

## 2012-06-30 MED ORDER — SODIUM CHLORIDE 0.9 % IV BOLUS (SEPSIS): 1000.0000 mL | INTRAVENOUS | Status: DC

## 2012-06-30 MED ORDER — SODIUM CHLORIDE 0.9 % IV BOLUS (SEPSIS)
500.0000 mL | Freq: Once | INTRAVENOUS | Status: AC
  Administered 2012-06-30: 03:00:00 via INTRAVENOUS

## 2012-06-30 MED ORDER — HYDROMORPHONE HCL PF 1 MG/ML IJ SOLN
INTRAMUSCULAR | Status: AC
  Administered 2012-06-30: 1 mg
  Filled 2012-06-30: qty 1

## 2012-06-30 MED ORDER — OXYCODONE-ACETAMINOPHEN 5-325 MG PO TABS: 1.0000 | ORAL_TABLET | Freq: Four times a day (QID) | ORAL | Status: DC | PRN

## 2012-06-30 MED ORDER — SULFAMETHOXAZOLE-TRIMETHOPRIM 800-160 MG PO TABS: 1.0000 | ORAL_TABLET | Freq: Two times a day (BID) | ORAL | Status: DC

## 2012-06-30 MED ORDER — SODIUM CHLORIDE 0.9 % IV SOLN
INTRAVENOUS | Status: DC
  Administered 2012-06-30: 16:00:00 via INTRAVENOUS

## 2012-06-30 MED ORDER — ONDANSETRON HCL 4 MG PO TABS: 4.0000 mg | ORAL_TABLET | Freq: Four times a day (QID) | ORAL | Status: DC | PRN

## 2012-06-30 MED ORDER — POLYETHYLENE GLYCOL 3350 17 G PO PACK
17.0000 g | PACK | Freq: Every day | ORAL | Status: DC | PRN
  Administered 2012-07-01 – 2012-07-02 (×2): 17 g via ORAL
  Filled 2012-06-30 (×2): qty 1

## 2012-06-30 MED ORDER — ONDANSETRON HCL 4 MG/2ML IJ SOLN
4.0000 mg | Freq: Once | INTRAMUSCULAR | Status: AC
  Administered 2012-06-30: 4 mg via INTRAVENOUS
  Filled 2012-06-30: qty 2

## 2012-06-30 MED ORDER — HYDROMORPHONE HCL PF 1 MG/ML IJ SOLN
0.5000 mg | INTRAMUSCULAR | Status: DC | PRN
  Administered 2012-06-30 – 2012-07-03 (×9): 0.5 mg via INTRAVENOUS
  Filled 2012-06-30 (×9): qty 1

## 2012-06-30 MED ORDER — SODIUM CHLORIDE 0.9 % IV BOLUS (SEPSIS): 1000.0000 mL | Freq: Once | INTRAVENOUS | Status: DC

## 2012-06-30 MED ORDER — ONDANSETRON HCL 4 MG/2ML IJ SOLN
4.0000 mg | Freq: Four times a day (QID) | INTRAMUSCULAR | Status: DC | PRN
  Administered 2012-07-01: 4 mg via INTRAVENOUS
  Filled 2012-06-30: qty 2

## 2012-06-30 MED ORDER — NICOTINE 21 MG/24HR TD PT24
21.0000 mg | MEDICATED_PATCH | Freq: Every day | TRANSDERMAL | Status: DC
  Administered 2012-07-01 – 2012-07-03 (×3): 21 mg via TRANSDERMAL
  Filled 2012-06-30 (×3): qty 1

## 2012-06-30 MED ORDER — ACETAMINOPHEN 650 MG RE SUPP: 650.0000 mg | Freq: Four times a day (QID) | RECTAL | Status: DC | PRN

## 2012-06-30 MED ORDER — SODIUM CHLORIDE 0.9 % IV SOLN
500.0000 mg | Freq: Four times a day (QID) | INTRAVENOUS | Status: DC
  Administered 2012-06-30 – 2012-07-03 (×10): 500 mg via INTRAVENOUS
  Filled 2012-06-30 (×11): qty 500

## 2012-06-30 MED ORDER — SODIUM CHLORIDE 0.9 % IV BOLUS (SEPSIS)
500.0000 mL | Freq: Once | INTRAVENOUS | Status: AC
  Administered 2012-06-30: 06:00:00 via INTRAVENOUS

## 2012-06-30 MED ORDER — OXYCODONE HCL 5 MG PO TABS
5.0000 mg | ORAL_TABLET | ORAL | Status: DC | PRN
  Administered 2012-06-30 – 2012-07-03 (×8): 5 mg via ORAL
  Filled 2012-06-30 (×8): qty 1

## 2012-06-30 MED ORDER — IOHEXOL 300 MG/ML  SOLN
100.0000 mL | Freq: Once | INTRAMUSCULAR | Status: AC | PRN
  Administered 2012-06-30: 100 mL via INTRAVENOUS

## 2012-06-30 MED ORDER — IBUPROFEN 400 MG PO TABS
400.0000 mg | ORAL_TABLET | Freq: Three times a day (TID) | ORAL | Status: DC
  Administered 2012-06-30 – 2012-07-02 (×7): 400 mg via ORAL
  Filled 2012-06-30 (×11): qty 1

## 2012-06-30 MED ORDER — SODIUM CHLORIDE 0.9 % IJ SOLN
3.0000 mL | Freq: Two times a day (BID) | INTRAMUSCULAR | Status: DC
  Administered 2012-07-01 – 2012-07-02 (×3): 3 mL via INTRAVENOUS

## 2012-06-30 MED ORDER — PRENATAL MULTIVITAMIN CH
1.0000 | ORAL_TABLET | Freq: Every day | ORAL | Status: DC
  Administered 2012-06-30 – 2012-07-03 (×4): 1 via ORAL
  Filled 2012-06-30 (×4): qty 1

## 2012-06-30 MED ORDER — SODIUM CHLORIDE 0.9 % IJ SOLN: 3.0000 mL | INTRAMUSCULAR | Status: DC | PRN

## 2012-06-30 MED ORDER — DEXTROSE 5 % IV SOLN
1.0000 g | Freq: Once | INTRAVENOUS | Status: DC
  Filled 2012-06-30: qty 10

## 2012-06-30 MED ORDER — ACETAMINOPHEN 325 MG PO TABS
650.0000 mg | ORAL_TABLET | Freq: Four times a day (QID) | ORAL | Status: DC | PRN
  Administered 2012-07-01: 650 mg via ORAL
  Filled 2012-06-30 (×2): qty 2

## 2012-06-30 MED ORDER — CYCLOBENZAPRINE HCL 5 MG PO TABS
5.0000 mg | ORAL_TABLET | Freq: Three times a day (TID) | ORAL | Status: DC
Start: 2012-06-30 — End: 2012-07-03
  Administered 2012-06-30 – 2012-07-03 (×9): 5 mg via ORAL
  Filled 2012-06-30 (×12): qty 1

## 2012-06-30 NOTE — ED Notes (Signed)
Per [facility]: Pt comes from [facility] c/o RLQ pain. Pt has extensive hx of psych disorders including bipolar for which she has been off meds for.

## 2012-06-30 NOTE — MAU Provider Note (Signed)
Agree with above 

## 2012-06-30 NOTE — ED Notes (Signed)
Reports nausea with ambulation and during standing with orthostatic.
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
## 2012-06-30 NOTE — ED Provider Notes (Signed)
Received sign-over from Dr. Rubin Payor at 0730.  Pt was receiving abx and IVF for treatment of pyelonephritis. On follow-up evaluation she still had an orthostatic dip in her blood pressure.  I administered an additional liter of saline.  She has required multiple doses of pain medication also.  Attempted to get her up and moving for discharge home.  She however continues to have severe pain that is exacerbated when walking.  I discussed her evaluation with the on-call hospitalist - plan admit for further mgmnt.  Tobin Chad, MD 06/30/12 1233
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
## 2012-06-30 NOTE — ED Notes (Signed)
MD at bedside. 

## 2012-06-30 NOTE — ED Notes (Signed)
Pt stuck x2 1 successful. Pt to be transferred upstairs. Pt asking for IV site to be changed. Labels sent to main lab
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
## 2012-07-01 DIAGNOSIS — F319 Bipolar disorder, unspecified: Secondary | ICD-10-CM

## 2012-07-01 LAB — COMPREHENSIVE METABOLIC PANEL
ALT: 19 U/L (ref 0–35)
AST: 32 U/L (ref 0–37)
CO2: 31 mEq/L (ref 19–32)
Calcium: 8.1 mg/dL — ABNORMAL LOW (ref 8.4–10.5)
GFR calc non Af Amer: >90 mL/min (ref >90)
Sodium: 138 mEq/L (ref 135–145)
Total Protein: 5.7 g/dL — ABNORMAL LOW (ref 6.0–8.3)

## 2012-07-01 LAB — CBC
MCH: 33.9 pg (ref 26.0–34.0)
Platelets: 257 10*3/uL (ref 150–400)
RBC: 3.42 MIL/uL — ABNORMAL LOW (ref 3.87–5.11)

## 2012-07-01 MED ORDER — DIVALPROEX SODIUM ER 500 MG PO TB24
500.0000 mg | ORAL_TABLET | Freq: Every day | ORAL | Status: DC
  Administered 2012-07-01 – 2012-07-03 (×3): 500 mg via ORAL
  Filled 2012-07-01 (×3): qty 1

## 2012-07-01 MED ORDER — NORETHINDRONE-ETH ESTRADIOL 0.5-35 MG-MCG PO TABS
1.0000 | ORAL_TABLET | Freq: Every day | ORAL | Status: DC
  Administered 2012-07-01 – 2012-07-03 (×3): 1 via ORAL

## 2012-07-01 MED ORDER — NON FORMULARY
1.0000 | Freq: Every day | Status: DC
  Administered 2012-07-01: 1 via ORAL

## 2012-07-01 MED ORDER — POTASSIUM CHLORIDE CRYS ER 20 MEQ PO TBCR
40.0000 meq | EXTENDED_RELEASE_TABLET | Freq: Two times a day (BID) | ORAL | Status: AC
  Administered 2012-07-01 (×2): 40 meq via ORAL
  Filled 2012-07-01 (×2): qty 2

## 2012-07-01 MED ORDER — PAROXETINE HCL 30 MG PO TABS
60.0000 mg | ORAL_TABLET | Freq: Every day | ORAL | Status: DC
  Administered 2012-07-01 – 2012-07-03 (×3): 60 mg via ORAL
  Filled 2012-07-01 (×3): qty 2

## 2012-07-01 MED ORDER — ATOMOXETINE HCL 40 MG PO CAPS
80.0000 mg | ORAL_CAPSULE | Freq: Every day | ORAL | Status: DC
  Administered 2012-07-01 – 2012-07-03 (×3): 80 mg via ORAL
  Filled 2012-07-01 (×4): qty 2

## 2012-07-01 NOTE — Significant Event (Signed)
Patient smoking in bathroom... AC notified.... Nicotine patch applied.Marland KitchenMarland KitchenMarland Kitchen

## 2012-07-01 NOTE — Progress Notes (Signed)
[facility] PROGRESS NOTE  Christina [name]:[id number] DOB: Jan 29, 1976 DOA: 06/29/2012 PCP: Kaleen Mask, MD  Assessment/Plan: 1. Acute right-sided pyelonephritis--WBC modestly improved. Continue antibiotics, IVF, pain control. Follow-up culture.  D-dimer negative.  2. Hypokalemia--replete. 3. Bipolar 1 disorder--appears stable.  4. Tobacco abuse--recommend cessation. 5. HSV--Valtrex. 6. 3 months post-partum--patient is not breastfeeding.  Code Status: full code Family Communication:  Disposition Plan: home when improved  [name], MD  [facility] Team 5 Pager [phone].  If 7PM-7AM, please contact night-coverage at [email or web address], password [facility] 07/01/2012, 12:54 PM  LOS: 2 days   Brief narrative: 36 year old woman presented with a 1-week history of right flank pains, chills, low grade pyrexia, urinary frequency and dysuria, suggestive of a urinary tract infection.  Consultants:  None  Procedures:  None  Antibiotics:  Primaxin 11/23 >>  HPI/Subjective: Feels worse, more pain in right flank, feels like she has a UTI.  Objective: Filed Vitals:   06/30/12 1506 06/30/12 1612 06/30/12 2200 07/01/12 0652  BP: 113/91  99/61 106/69  Pulse: 82  95 93  Temp:   98.8 F (37.1 C) 97.5 F (36.4 C)  TempSrc:   Oral Oral  Resp:   18 20  Height:  6' (1.829 m)    Weight:  103.556 kg (228 lb 4.8 oz)    SpO2:   96% 90%    Intake/Output Summary (Last 24 hours) at 07/01/12 1254 Last data filed at 07/01/12 0600  Gross per 24 hour  Intake   1560 ml  Output      0 ml  Net   1560 ml   Filed Weights   06/29/12 2000 06/30/12 1612  Weight: 100.245 kg (221 lb) 103.556 kg (228 lb 4.8 oz)    Exam:  General:  Appears calm and comfortable, moves easily in bed and stands easily without apparent pain. Sits in chair and stands again without apparent pain. Cardiovascular: RRR, no m/r/g. No LE edema. Respiratory: CTA bilaterally, no w/r/r. Normal  respiratory effort. Abdomen: soft, ntnd. Mild right flank pain/CVA pain. Psychiatric: grossly normal mood and affect, speech fluent and appropriate  Data Reviewed: Basic Metabolic Panel:  Lab 07/01/12 8295 06/30/12 1655  NA 138 135  K 3.4* 3.3*  CL 100 98  CO2 31 29  GLUCOSE 107* 93  BUN 6 8  CREATININE 0.59 0.62  CALCIUM 8.1* 7.9*  MG -- --  PHOS -- --   Liver Function Tests:  Lab 07/01/12 0520 06/30/12 1655  AST 32 16  ALT 19 11  ALKPHOS 138* 89  BILITOT 0.1* 0.2*  PROT 5.7* 5.9*  ALBUMIN 2.1* 2.2*   CBC:  Lab 07/01/12 0520 06/30/12 1655 06/29/12 2229  WBC 17.3* 19.7* 18.7*  NEUTROABS -- -- 14.2*  HGB 11.6* 12.1 11.1*  HCT 34.5* 35.1* 32.3*  MCV 100.9* 99.4 100.9*  PLT 257 242 221   CBG:  Lab 06/30/12 1758  GLUCAP 72    Recent Results (from the past 240 hour(s))  WET PREP, GENITAL     Status: Normal   Collection Time   06/29/12 10:30 PM      Component Value Range Status Comment   Yeast Wet Prep HPF POC NONE SEEN  NONE SEEN Final    Trich, Wet Prep NONE SEEN  NONE SEEN Final    Clue Cells Wet Prep HPF POC NONE SEEN  NONE SEEN Final    WBC, Wet Prep HPF POC NONE SEEN  NONE SEEN Final FEW BACTERIA SEEN     Studies: US  Transvaginal Non-ob  06/30/2012  *RADIOLOGY REPORT*  Clinical Data: Right lower quadrant pain for 1 week.  Cesarean section and tubal ligation 3 months ago.  TRANSABDOMINAL AND TRANSVAGINAL ULTRASOUND OF PELVIS Technique:  Both transabdominal and transvaginal ultrasound examinations of the pelvis were performed. Transabdominal technique was performed for global imaging of the pelvis including uterus, ovaries, adnexal regions, and pelvic cul-de-sac.  It was necessary to proceed with endovaginal exam following the transabdominal exam to visualize the uterus and ovaries.  Patient unable to lie flat due to pain.  Comparison:  None  Findings:  Uterus: The uterus is minimally retroverted.  The uterus is mildly enlarged and heterogeneous consistent  with postpartum state. Uterus measures 7.7 x 4.7 x 6 cm.  There is irregularity and deformity of the anterior uterine wall consistent with postoperative change.  Endometrium: Endometrial stripe thickness measures 9.1 mm. Hyperechoic focus with ring down artifact in the endometrium may represent a polyp versus calcification.  No abnormal endometrial fluid collections.  Right ovary:  The right ovary measures 2.7 x 1.6 x 1.6 cm.  Normal follicular changes are demonstrated.  Flow is demonstrated in the right ovary on color flow Doppler imaging.  Left ovary: The left ovary measures 2.5 x 1.4 x 1.1 cm.  Normal follicular changes are demonstrated.  Flow is demonstrated within the left ovary on color flow Doppler imaging.  Other findings: No free pelvic fluid collections.  IMPRESSION: Postoperative changes in the uterus.  Hyperechoic focus in the endometrium may represent a polyp or calcification.  No abnormal adnexal masses.  No free fluid.   Original Report Authenticated By: Burman Nieves, M.D.    US Pelvis Complete  06/30/2012  *RADIOLOGY REPORT*  Clinical Data: Right lower quadrant pain for 1 week.  Cesarean section and tubal ligation 3 months ago.  TRANSABDOMINAL AND TRANSVAGINAL ULTRASOUND OF PELVIS Technique:  Both transabdominal and transvaginal ultrasound examinations of the pelvis were performed. Transabdominal technique was performed for global imaging of the pelvis including uterus, ovaries, adnexal regions, and pelvic cul-de-sac.  It was necessary to proceed with endovaginal exam following the transabdominal exam to visualize the uterus and ovaries.  Patient unable to lie flat due to pain.  Comparison:  None  Findings:  Uterus: The uterus is minimally retroverted.  The uterus is mildly enlarged and heterogeneous consistent with postpartum state. Uterus measures 7.7 x 4.7 x 6 cm.  There is irregularity and deformity of the anterior uterine wall consistent with postoperative change.  Endometrium:  Endometrial stripe thickness measures 9.1 mm. Hyperechoic focus with ring down artifact in the endometrium may represent a polyp versus calcification.  No abnormal endometrial fluid collections.  Right ovary:  The right ovary measures 2.7 x 1.6 x 1.6 cm.  Normal follicular changes are demonstrated.  Flow is demonstrated in the right ovary on color flow Doppler imaging.  Left ovary: The left ovary measures 2.5 x 1.4 x 1.1 cm.  Normal follicular changes are demonstrated.  Flow is demonstrated within the left ovary on color flow Doppler imaging.  Other findings: No free pelvic fluid collections.  IMPRESSION: Postoperative changes in the uterus.  Hyperechoic focus in the endometrium may represent a polyp or calcification.  No abnormal adnexal masses.  No free fluid.   Original Report Authenticated By: Burman Nieves, M.D.    Ct Abdomen Pelvis W Contrast  06/30/2012  *RADIOLOGY REPORT*  Clinical Data: Right lower quadrant pain for 1 week.  3 months post C-section and tubal ligation.  White cell count 18.7.  CT ABDOMEN AND PELVIS WITH CONTRAST  Technique:  Multidetector CT imaging of the abdomen and pelvis was performed following the standard protocol during bolus administration of intravenous contrast.  Contrast: OMNIPAQUE IOHEXOL 300 MG/ML  SOLN  Comparison: 08/18/2008  Findings: Mild dependent atelectasis in the lung bases.  The liver, spleen, gallbladder, pancreas, adrenal glands, abdominal aorta, and retroperitoneal lymph nodes are unremarkable.  Hazy areas of asymmetric appearance of the nephrogram on the right without apparent mass or contour deformity.  This suggests pyelonephritis. No pyelocaliectasis or ureterectasis.  The stomach, small bowel, and colon are not abnormally distended.  The colon is stool-filled.  No free air or free fluid in the abdomen.  Pelvis:  Mild prominence of the uterus consistent with postpartum state.  No significant endometrial fluid collection.  Surgical clips consistent  with tubal ligations.  No abnormal adnexal masses. The bladder is decompressed.  The appendix is normal.  No diverticulitis.  No free or loculated pelvic fluid collections.  No significant pelvic lymphadenopathy.  Normal alignment of the lumbar vertebrae.  IMPRESSION: Heterogeneous right renal parenchymal nephrograms suggesting pyelonephritis.  No abscess identified.   Original Report Authenticated By: Burman Nieves, M.D.     Scheduled Meds:   . atomoxetine  80 mg Oral Daily  . cyclobenzaprine  5 mg Oral TID  . divalproex  500 mg Oral Daily  . enoxaparin (LOVENOX) injection  50 mg Subcutaneous Q24H  . ibuprofen  400 mg Oral TID WC  . [COMPLETED] imipenem-cilastatin  500 mg Intravenous Once  . imipenem-cilastatin  500 mg Intravenous Q6H  . nicotine  21 mg Transdermal Daily  . norethindrone-ethinyl estradiol  1 tablet Oral Daily  . pantoprazole  40 mg Oral Q0600  . PARoxetine  60 mg Oral Daily  . potassium chloride  40 mEq Oral BID  . prenatal multivitamin  1 tablet Oral Daily  . sodium chloride  3 mL Intravenous Q12H  . valACYclovir  500 mg Oral Daily  . [DISCONTINUED] cefTRIAXone (ROCEPHIN)  IV  1 g Intravenous Once  . [DISCONTINUED] enoxaparin (LOVENOX) injection  40 mg Subcutaneous Q24H  . [DISCONTINUED] NON FORMULARY 1 tablet  1 tablet Oral Daily  . [DISCONTINUED] sodium chloride  1,000 mL Intravenous Once  . [DISCONTINUED] sodium chloride  1,000 mL Intravenous STAT  . [DISCONTINUED] sodium chloride  1,000 mL Intravenous Once   Continuous Infusions:   . sodium chloride 100 mL/hr at 06/30/12 1624    Principal Problem:  *Pyelonephritis Active Problems:  Right flank pain  Bipolar 1 disorder  Tobacco abuse     [name], MD  [facility] Team 5 Pager [phone].  If 7PM-7AM, please contact night-coverage at [email or web address], password [email or web address] 07/01/2012, 12:54 PM  LOS: 2 days   Time spent: 20 minutes

## 2012-07-02 LAB — CBC
Hemoglobin: 10.6 g/dL — ABNORMAL LOW (ref 12.0–15.0)
MCH: 34.2 pg — ABNORMAL HIGH (ref 26.0–34.0)
MCV: 100 fL (ref 78.0–100.0)
RBC: 3.1 MIL/uL — ABNORMAL LOW (ref 3.87–5.11)

## 2012-07-02 LAB — BASIC METABOLIC PANEL
BUN: 5 mg/dL — ABNORMAL LOW (ref 6–23)
CO2: 28 mEq/L (ref 19–32)
Calcium: 7.8 mg/dL — ABNORMAL LOW (ref 8.4–10.5)
Creatinine, Ser: 0.57 mg/dL (ref 0.50–1.10)
Glucose, Bld: 125 mg/dL — ABNORMAL HIGH (ref 70–99)

## 2012-07-02 NOTE — Progress Notes (Signed)
[facility] PROGRESS NOTE  [name]:[id number] DOB: Jun 27, 1976 DOA: 06/29/2012 PCP: Kaleen Mask, MD  Assessment/Plan: 1. Acute right-sided pyelonephritis--WBC much improved. Continue antibiotics, IVF, pain control. Follow-up culture.   2. Hypokalemia--repleted. 3. Bipolar 1 disorder--appears stable.  4. Tobacco abuse--recommend cessation. 5. HSV--Valtrex. 6. 3 months post-partum--patient is not breastfeeding.  Code Status: full code Family Communication: none present Disposition Plan: home when improved  [name], MD  [facility] Team 5 Pager [phone].  If 7PM-7AM, please contact night-coverage at [email or web address], password [facility] 07/02/2012, 2:02 PM  LOS: 3 days   Brief narrative: 37 year old woman presented with a 1-week history of right flank pains, chills, low grade pyrexia, urinary frequency and dysuria, suggestive of a urinary tract infection.  Consultants:  None  Procedures:  None  Antibiotics:  Primaxin 11/23 >>  HPI/Subjective: Still has pain in right flank. Taking liquids ok. Doesn't feel any better.  Objective: Filed Vitals:   07/01/12 1346 07/01/12 2234 07/02/12 0627 07/02/12 1324  BP: 103/66 92/57 112/62 117/78  Pulse: 84 87 82 74  Temp: 97.7 F (36.5 C) 98 F (36.7 C) 98.3 F (36.8 C) 98.5 F (36.9 C)  TempSrc: Oral Oral Oral Oral  Resp: 20 20 20 18   Height:      Weight:      SpO2: 96% 96% 93% 95%    Intake/Output Summary (Last 24 hours) at 07/02/12 1402 Last data filed at 07/02/12 0600  Gross per 24 hour  Intake   1200 ml  Output      0 ml  Net   1200 ml   Filed Weights   06/29/12 2000 06/30/12 1612  Weight: 100.245 kg (221 lb) 103.556 kg (228 lb 4.8 oz)    Exam:  General:  Appears calm and comfortable, moves easily in bed. Cardiovascular: RRR, no m/r/g. No LE edema. Respiratory: CTA bilaterally, no w/r/r. Normal respiratory effort. Abdomen: soft, ntnd. Mild right flank pain/CVA  pain. Psychiatric: grossly normal mood and affect, speech fluent and appropriate  Data Reviewed: Basic Metabolic Panel:  Lab 07/02/12 8295 07/01/12 0520 06/30/12 1655  NA 134* 138 135  K 3.7 3.4* 3.3*  CL 101 100 98  CO2 28 31 29   GLUCOSE 125* 107* 93  BUN 5* 6 8  CREATININE 0.57 0.59 0.62  CALCIUM 7.8* 8.1* 7.9*  MG -- -- --  PHOS -- -- --   Liver Function Tests:  Lab 07/01/12 0520 06/30/12 1655  AST 32 16  ALT 19 11  ALKPHOS 138* 89  BILITOT 0.1* 0.2*  PROT 5.7* 5.9*  ALBUMIN 2.1* 2.2*   CBC:  Lab 07/02/12 0509 07/01/12 0520 06/30/12 1655 06/29/12 2229  WBC 12.7* 17.3* 19.7* 18.7*  NEUTROABS -- -- -- 14.2*  HGB 10.6* 11.6* 12.1 11.1*  HCT 31.0* 34.5* 35.1* 32.3*  MCV 100.0 100.9* 99.4 100.9*  PLT 263 257 242 221   CBG:  Lab 06/30/12 1758  GLUCAP 72    Recent Results (from the past 240 hour(s))  URINE CULTURE     Status: Normal (Preliminary result)   Collection Time   06/29/12  8:05 PM      Component Value Range Status Comment   Specimen Description URINE, CLEAN CATCH   Final    Special Requests NONE   Final    Culture  Setup Time 06/30/2012 05:30   Final    Colony Count 95,000 COLONIES/ML   Final    Culture ESCHERICHIA COLI   Final    Report Status PENDING  Incomplete   WET PREP, GENITAL     Status: Normal   Collection Time   06/29/12 10:30 PM      Component Value Range Status Comment   Yeast Wet Prep HPF POC NONE SEEN  NONE SEEN Final    Trich, Wet Prep NONE SEEN  NONE SEEN Final    Clue Cells Wet Prep HPF POC NONE SEEN  NONE SEEN Final    WBC, Wet Prep HPF POC NONE SEEN  NONE SEEN Final FEW BACTERIA SEEN  CULTURE, BLOOD (ROUTINE X 2)     Status: Normal (Preliminary result)   Collection Time   06/30/12  3:25 PM      Component Value Range Status Comment   Specimen Description BLOOD LEFT ARM  4 ML IN [facility] BOTTLE   Final    Special Requests NONE   Final    Culture  Setup Time 06/30/2012 21:56   Final    Culture     Final    Value:        BLOOD  CULTURE RECEIVED NO GROWTH TO DATE CULTURE WILL BE HELD FOR 5 DAYS BEFORE ISSUING A FINAL NEGATIVE REPORT   Report Status PENDING   Incomplete   CULTURE, BLOOD (ROUTINE X 2)     Status: Normal (Preliminary result)   Collection Time   06/30/12  4:55 PM      Component Value Range Status Comment   Specimen Description BLOOD LEFT ARM  5 ML IN [facility] BOTTLE   Final    Special Requests NONE   Final    Culture  Setup Time 06/30/2012 21:57   Final    Culture     Final    Value:        BLOOD CULTURE RECEIVED NO GROWTH TO DATE CULTURE WILL BE HELD FOR 5 DAYS BEFORE ISSUING A FINAL NEGATIVE REPORT   Report Status PENDING   Incomplete      Studies: No results found.  Scheduled Meds:    . atomoxetine  80 mg Oral Daily  . cyclobenzaprine  5 mg Oral TID  . divalproex  500 mg Oral Daily  . enoxaparin (LOVENOX) injection  50 mg Subcutaneous Q24H  . ibuprofen  400 mg Oral TID WC  . imipenem-cilastatin  500 mg Intravenous Q6H  . nicotine  21 mg Transdermal Daily  . norethindrone-ethinyl estradiol  1 tablet Oral Daily  . pantoprazole  40 mg Oral Q0600  . PARoxetine  60 mg Oral Daily  . [COMPLETED] potassium chloride  40 mEq Oral BID  . prenatal multivitamin  1 tablet Oral Daily  . sodium chloride  3 mL Intravenous Q12H  . valACYclovir  500 mg Oral Daily   Continuous Infusions:    . sodium chloride 100 mL/hr at 06/30/12 1624    Principal Problem:  *Pyelonephritis Active Problems:  Right flank pain  Bipolar 1 disorder  Tobacco abuse     [name], MD  [facility] Team 5 Pager [phone].  If 7PM-7AM, please contact night-coverage at [email or web address], password [facility] 07/02/2012, 2:02 PM  LOS: 3 days   Time spent: 20 minutes

## 2012-07-03 LAB — CBC
MCH: 34.1 pg — ABNORMAL HIGH (ref 26.0–34.0)
MCV: 100 fL (ref 78.0–100.0)
Platelets: 266 10*3/uL (ref 150–400)
RDW: 13.5 % (ref 11.5–15.5)

## 2012-07-03 LAB — URINE CULTURE

## 2012-07-03 MED ORDER — CIPROFLOXACIN HCL 500 MG PO TABS: 500.0000 mg | ORAL_TABLET | Freq: Two times a day (BID) | ORAL | Status: DC

## 2012-07-03 MED ORDER — OXYCODONE-ACETAMINOPHEN 5-325 MG PO TABS
1.0000 | ORAL_TABLET | ORAL | Status: DC | PRN
  Administered 2012-07-03 (×2): 1 via ORAL
  Filled 2012-07-03 (×2): qty 1

## 2012-07-03 MED ORDER — POLYETHYLENE GLYCOL 3350 17 G PO PACK: 17.0000 g | PACK | Freq: Every day | ORAL | Status: DC | PRN

## 2012-07-03 MED ORDER — CIPROFLOXACIN HCL 500 MG PO TABS
500.0000 mg | ORAL_TABLET | Freq: Two times a day (BID) | ORAL | Status: DC
  Administered 2012-07-03: 500 mg via ORAL
  Filled 2012-07-03 (×6): qty 1

## 2012-07-03 MED ORDER — OXYCODONE-ACETAMINOPHEN 5-325 MG PO TABS: 1.0000 | ORAL_TABLET | Freq: Four times a day (QID) | ORAL | Status: DC | PRN

## 2012-07-03 NOTE — Discharge Summary (Signed)
Physician Discharge Summary  ERLEAN HARKNESS ZOX:[id number] DOB: 1976/04/21 DOA: 06/29/2012  PCP: Kaleen Mask, MD  Admit date: 06/29/2012 Discharge date: 07/03/2012  Recommendations for Outpatient Follow-up:  1. Resolution of pyelonephritis  Follow-up Information    Follow up with Chelsea Aus, MD. (As needed)    Contact information:   [address] [place] [address] [phone]       Follow up with Kaleen Mask, MD. In 1 week.   Contact information:   [address] [place] [address] [phone]         Discharge Diagnoses:  1. Acute right-sided E coli pyelonephritis 2. Hypokalemia.  3. Bipolar 1 disorder 4. Tobacco abuse 5. 3 months post-partum  Discharge Condition: improved Disposition: home  Diet recommendation: regular  Filed Weights   06/29/12 2000 06/30/12 1612  Weight: 100.245 kg (221 lb) 103.556 kg (228 lb 4.8 oz)    History of present illness:  36 year old woman presented with a 1-week history of right flank pains, chills, low grade pyrexia, urinary frequency and dysuria, suggestive of a urinary tract infection.  Hospital Course:  Ms. Burse was admitted for acute pyelonephritis. Although some pain persisted, overall she rapidly improved with empiric antibiotics, narrowed to Cipro based on sensitivities. Her hospitalization was uncomplicated and on day of discharge she was tolerating a solid diet and walking the halls without difficulty.  1. Acute right-sided E coli pyelonephritis--afebrile, WBC near normal. BC NGTD. Finish oral abx. 2. Hypokalemia--repleted.  3. Bipolar 1 disorder--appears stable.   4. Tobacco abuse--recommend cessation.  5. HSV--Valtrex.  6. 3 months post-partum--patient is not breastfeeding.  Consultants:  None  Procedures:  None  Antibiotics:  Primaxin 11/23 >> 11/26   Cipro 11/26 >> 12/2   Discharge Instructions  Discharge Orders    Future Orders Please  Complete By Expires   Diet general      Increase activity slowly      Discharge instructions      Comments:   Be sure to finish antibiotic (Cipro) for right kidney infection (pyelonephritis). Call physician or seek immediate medical attention for increased pain, fever, vomiting or worsening of condition.       Medication List     As of 07/03/2012  1:30 PM    STOP taking these medications         acetaminophen 500 MG tablet   Commonly known as: TYLENOL      TAKE these medications         calcium carbonate 500 MG chewable tablet   Commonly known as: TUMS - dosed in mg elemental calcium   Chew 2 tablets by mouth as needed. For heartburn      ciprofloxacin 500 MG tablet   Commonly known as: CIPRO   Take 1 tablet (500 mg total) by mouth 2 (two) times daily. Start 11/26 PM      oxyCODONE-acetaminophen 5-325 MG per tablet   Commonly known as: PERCOCET/ROXICET   Take 1 tablet by mouth every 6 (six) hours as needed for pain.      polyethylene glycol packet   Commonly known as: MIRALAX / GLYCOLAX   Take 17 g by mouth daily as needed. For constipation      prenatal multivitamin Tabs   Take 1 tablet by mouth daily.      valACYclovir 500 MG tablet   Commonly known as: VALTREX   Take 500 mg by mouth daily.         The results of  significant diagnostics from this hospitalization (including imaging, microbiology, ancillary and laboratory) are listed below for reference.    Significant Diagnostic Studies: Ct Abdomen Pelvis W Contrast  06/30/2012  *RADIOLOGY REPORT*  Clinical Data: Right lower quadrant pain for 1 week.  3 months post C-section and tubal ligation.  White cell count 18.7.  CT ABDOMEN AND PELVIS WITH CONTRAST  Technique:  Multidetector CT imaging of the abdomen and pelvis was performed following the standard protocol during bolus administration of intravenous contrast.  Contrast: OMNIPAQUE IOHEXOL 300 MG/ML  SOLN  Comparison: 08/18/2008  Findings: Mild dependent  atelectasis in the lung bases.  The liver, spleen, gallbladder, pancreas, adrenal glands, abdominal aorta, and retroperitoneal lymph nodes are unremarkable.  Hazy areas of asymmetric appearance of the nephrogram on the right without apparent mass or contour deformity.  This suggests pyelonephritis. No pyelocaliectasis or ureterectasis.  The stomach, small bowel, and colon are not abnormally distended.  The colon is stool-filled.  No free air or free fluid in the abdomen.  Pelvis:  Mild prominence of the uterus consistent with postpartum state.  No significant endometrial fluid collection.  Surgical clips consistent with tubal ligations.  No abnormal adnexal masses. The bladder is decompressed.  The appendix is normal.  No diverticulitis.  No free or loculated pelvic fluid collections.  No significant pelvic lymphadenopathy.  Normal alignment of the lumbar vertebrae.  IMPRESSION: Heterogeneous right renal parenchymal nephrograms suggesting pyelonephritis.  No abscess identified.   Original Report Authenticated By: Burman Nieves, M.D.     Microbiology: Recent Results (from the past 240 hour(s))  URINE CULTURE     Status: Normal   Collection Time   06/29/12  8:05 PM      Component Value Range Status Comment   Specimen Description URINE, CLEAN CATCH   Final    Special Requests NONE   Final    Culture  Setup Time 06/30/2012 05:30   Final    Colony Count 95,000 COLONIES/ML   Final    Culture ESCHERICHIA COLI   Final    Report Status 07/03/2012 FINAL   Final    Organism ID, Bacteria ESCHERICHIA COLI   Final   WET PREP, GENITAL     Status: Normal   Collection Time   06/29/12 10:30 PM      Component Value Range Status Comment   Yeast Wet Prep HPF POC NONE SEEN  NONE SEEN Final    Trich, Wet Prep NONE SEEN  NONE SEEN Final    Clue Cells Wet Prep HPF POC NONE SEEN  NONE SEEN Final    WBC, Wet Prep HPF POC NONE SEEN  NONE SEEN Final FEW BACTERIA SEEN  CULTURE, BLOOD (ROUTINE X 2)     Status: Normal  (Preliminary result)   Collection Time   06/30/12  3:25 PM      Component Value Range Status Comment   Specimen Description BLOOD LEFT ARM  4 ML IN [facility] BOTTLE   Final    Special Requests NONE   Final    Culture  Setup Time 06/30/2012 21:56   Final    Culture     Final    Value:        BLOOD CULTURE RECEIVED NO GROWTH TO DATE CULTURE WILL BE HELD FOR 5 DAYS BEFORE ISSUING A FINAL NEGATIVE REPORT   Report Status PENDING   Incomplete   CULTURE, BLOOD (ROUTINE X 2)     Status: Normal (Preliminary result)   Collection Time   06/30/12  4:55 PM      Component Value Range Status Comment   Specimen Description BLOOD LEFT ARM  5 ML IN [facility] BOTTLE   Final    Special Requests NONE   Final    Culture  Setup Time 06/30/2012 21:57   Final    Culture     Final    Value:        BLOOD CULTURE RECEIVED NO GROWTH TO DATE CULTURE WILL BE HELD FOR 5 DAYS BEFORE ISSUING A FINAL NEGATIVE REPORT   Report Status PENDING   Incomplete      Labs: Basic Metabolic Panel:  Lab 07/02/12 4098 07/01/12 0520 06/30/12 1655  NA 134* 138 135  K 3.7 3.4* 3.3*  CL 101 100 98  CO2 28 31 29   GLUCOSE 125* 107* 93  BUN 5* 6 8  CREATININE 0.57 0.59 0.62  CALCIUM 7.8* 8.1* 7.9*  MG -- -- --  PHOS -- -- --   Liver Function Tests:  Lab 07/01/12 0520 06/30/12 1655  AST 32 16  ALT 19 11  ALKPHOS 138* 89  BILITOT 0.1* 0.2*  PROT 5.7* 5.9*  ALBUMIN 2.1* 2.2*   CBC:  Lab 07/03/12 0500 07/02/12 0509 07/01/12 0520 06/30/12 1655 06/29/12 2229  WBC 11.0* 12.7* 17.3* 19.7* 18.7*  NEUTROABS -- -- -- -- 14.2*  HGB 10.4* 10.6* 11.6* 12.1 11.1*  HCT 30.5* 31.0* 34.5* 35.1* 32.3*  MCV 100.0 100.0 100.9* 99.4 100.9*  PLT 266 263 257 242 221   CBG:  Lab 06/30/12 1758  GLUCAP 72    Principal Problem:  *Pyelonephritis Active Problems:  Right flank pain  Bipolar 1 disorder  Tobacco abuse   Time coordinating discharge: 25 minutes  Signed:  [name], MD [facility] 07/03/2012, 1:30 PM

## 2012-07-03 NOTE — Progress Notes (Signed)
[facility] PROGRESS NOTE  Christina [name] ZOX:[id number] DOB: 1976-08-06 DOA: 06/29/2012 PCP: Kaleen Mask, MD  Assessment/Plan: 1. Acute right-sided E coli pyelonephritis--afebrile, WBC near normal. BC NGTD. Change to oral abx, SL IV. Focus on oral pain control, up out of bed, ambulate. 2. Hypokalemia--repleted. 3. Bipolar 1 disorder--appears stable.  4. Tobacco abuse--recommend cessation. 5. HSV--Valtrex. 6. 3 months post-partum--patient is not breastfeeding.  Code Status: full code Family Communication: none present Disposition Plan: home later today vs. AM.  [name], MD  [facility] Team 5 Pager [phone].  If 7PM-7AM, please contact night-coverage at [email or web address], password [email or web address] 07/03/2012, 10:00 AM  LOS: 4 days   Brief narrative: 36 year old woman presented with a 1-week history of right flank pains, chills, low grade pyrexia, urinary frequency and dysuria, suggestive of a urinary tract infection.  Consultants:  None  Procedures:  None  Antibiotics:  Primaxin 11/23 >> 11/26  Cipro 11/26 >> 12/2   HPI/Subjective: Still has pain in right flank. Poor appetite, sore throat.  Objective: Filed Vitals:   07/02/12 0627 07/02/12 1324 07/02/12 2212 07/03/12 0615  BP: 112/62 117/78 106/70 113/75  Pulse: 82 74 83 84  Temp: 98.3 F (36.8 C) 98.5 F (36.9 C) 98.1 F (36.7 C) 98.3 F (36.8 C)  TempSrc: Oral Oral Oral Oral  Resp: 20 18 18 18   Height:      Weight:      SpO2: 93% 95% 94% 95%   No intake or output data in the 24 hours ending 07/03/12 1000 Filed Weights   06/29/12 2000 06/30/12 1612  Weight: 100.245 kg (221 lb) 103.556 kg (228 lb 4.8 oz)    Exam:  General:  Appears calm and comfortable, moves easily in bed. ENT: mouth/oropharynx appear unremarkable. Cardiovascular: RRR, no m/r/g.  Respiratory: CTA bilaterally, no w/r/r. Normal respiratory effort. Abdomen: soft, ntnd. Mild right flank pain/CVA pain. Psychiatric:  grossly normal mood and affect, speech fluent and appropriate  Data Reviewed: Basic Metabolic Panel:  Lab 07/02/12 8295 07/01/12 0520 06/30/12 1655  NA 134* 138 135  K 3.7 3.4* 3.3*  CL 101 100 98  CO2 28 31 29   GLUCOSE 125* 107* 93  BUN 5* 6 8  CREATININE 0.57 0.59 0.62  CALCIUM 7.8* 8.1* 7.9*  MG -- -- --  PHOS -- -- --   Liver Function Tests:  Lab 07/01/12 0520 06/30/12 1655  AST 32 16  ALT 19 11  ALKPHOS 138* 89  BILITOT 0.1* 0.2*  PROT 5.7* 5.9*  ALBUMIN 2.1* 2.2*   CBC:  Lab 07/03/12 0500 07/02/12 0509 07/01/12 0520 06/30/12 1655 06/29/12 2229  WBC 11.0* 12.7* 17.3* 19.7* 18.7*  NEUTROABS -- -- -- -- 14.2*  HGB 10.4* 10.6* 11.6* 12.1 11.1*  HCT 30.5* 31.0* 34.5* 35.1* 32.3*  MCV 100.0 100.0 100.9* 99.4 100.9*  PLT 266 263 257 242 221   CBG:  Lab 06/30/12 1758  GLUCAP 72    Recent Results (from the past 240 hour(s))  URINE CULTURE     Status: Normal   Collection Time   06/29/12  8:05 PM      Component Value Range Status Comment   Specimen Description URINE, CLEAN CATCH   Final    Special Requests NONE   Final    Culture  Setup Time 06/30/2012 05:30   Final    Colony Count 95,000 COLONIES/ML   Final    Culture ESCHERICHIA COLI   Final    Report Status 07/03/2012 FINAL   Final  Organism ID, Bacteria ESCHERICHIA COLI   Final   WET PREP, GENITAL     Status: Normal   Collection Time   06/29/12 10:30 PM      Component Value Range Status Comment   Yeast Wet Prep HPF POC NONE SEEN  NONE SEEN Final    Trich, Wet Prep NONE SEEN  NONE SEEN Final    Clue Cells Wet Prep HPF POC NONE SEEN  NONE SEEN Final    WBC, Wet Prep HPF POC NONE SEEN  NONE SEEN Final FEW BACTERIA SEEN  CULTURE, BLOOD (ROUTINE X 2)     Status: Normal (Preliminary result)   Collection Time   06/30/12  3:25 PM      Component Value Range Status Comment   Specimen Description BLOOD LEFT ARM  4 ML IN [facility] BOTTLE   Final    Special Requests NONE   Final    Culture  Setup Time 06/30/2012  21:56   Final    Culture     Final    Value:        BLOOD CULTURE RECEIVED NO GROWTH TO DATE CULTURE WILL BE HELD FOR 5 DAYS BEFORE ISSUING A FINAL NEGATIVE REPORT   Report Status PENDING   Incomplete   CULTURE, BLOOD (ROUTINE X 2)     Status: Normal (Preliminary result)   Collection Time   06/30/12  4:55 PM      Component Value Range Status Comment   Specimen Description BLOOD LEFT ARM  5 ML IN [facility] BOTTLE   Final    Special Requests NONE   Final    Culture  Setup Time 06/30/2012 21:57   Final    Culture     Final    Value:        BLOOD CULTURE RECEIVED NO GROWTH TO DATE CULTURE WILL BE HELD FOR 5 DAYS BEFORE ISSUING A FINAL NEGATIVE REPORT   Report Status PENDING   Incomplete      Studies: No results found.  Scheduled Meds:    . atomoxetine  80 mg Oral Daily  . cyclobenzaprine  5 mg Oral TID  . divalproex  500 mg Oral Daily  . enoxaparin (LOVENOX) injection  50 mg Subcutaneous Q24H  . ibuprofen  400 mg Oral TID WC  . imipenem-cilastatin  500 mg Intravenous Q6H  . nicotine  21 mg Transdermal Daily  . norethindrone-ethinyl estradiol  1 tablet Oral Daily  . pantoprazole  40 mg Oral Q0600  . PARoxetine  60 mg Oral Daily  . prenatal multivitamin  1 tablet Oral Daily  . sodium chloride  3 mL Intravenous Q12H  . valACYclovir  500 mg Oral Daily   Continuous Infusions:    . [DISCONTINUED] sodium chloride 100 mL/hr at 06/30/12 1624    Principal Problem:  *Pyelonephritis Active Problems:  Right flank pain  Bipolar 1 disorder  Tobacco abuse     [name], MD  [facility] Team 5 Pager [phone].  If 7PM-7AM, please contact night-coverage at [email or web address], password [facility] 07/03/2012, 10:00 AM  LOS: 4 days   Time spent: 15 minutes

## 2012-07-06 LAB — CULTURE, BLOOD (ROUTINE X 2): Culture: NO GROWTH

## 2012-08-14 IMAGING — US US OB TRANSVAGINAL
1 series · 13 of 25 positions shown · non-contrast
Comparison: Pelvic ultrasound performed 08/05/2011

CLINICAL DATA: Pelvic pain.

TRANSVAGINAL OBSTETRIC US
TECHNIQUE: Transvaginal ultrasound was performed for complete
evaluation of the gestation as well as the maternal uterus, adnexal
regions, and pelvic cul-de-sac.

[Series 1: us ob transvaginal · 13 of 25 slices shown]
[im 1/25]
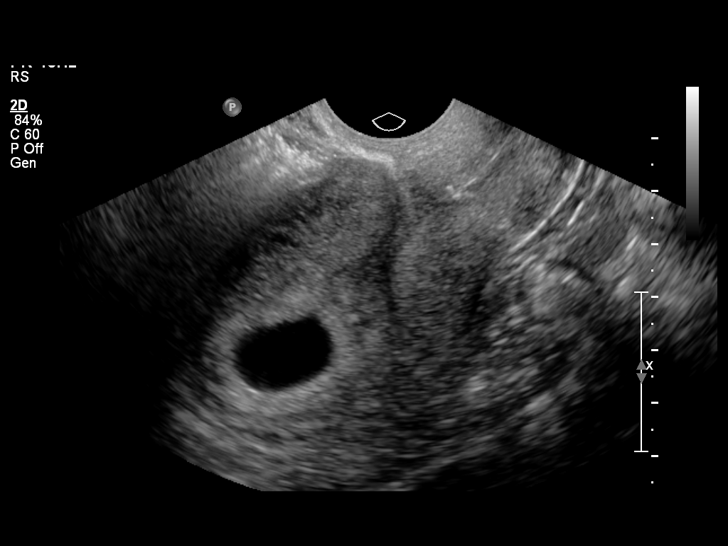
[im 3/25]
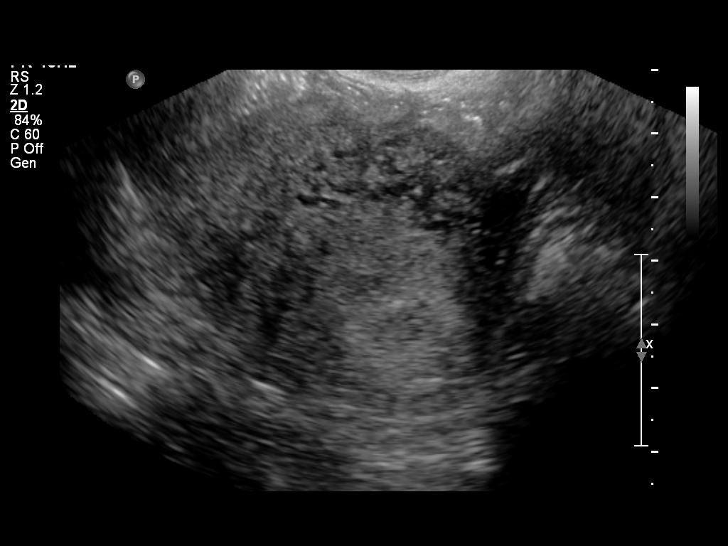
[im 5/25]
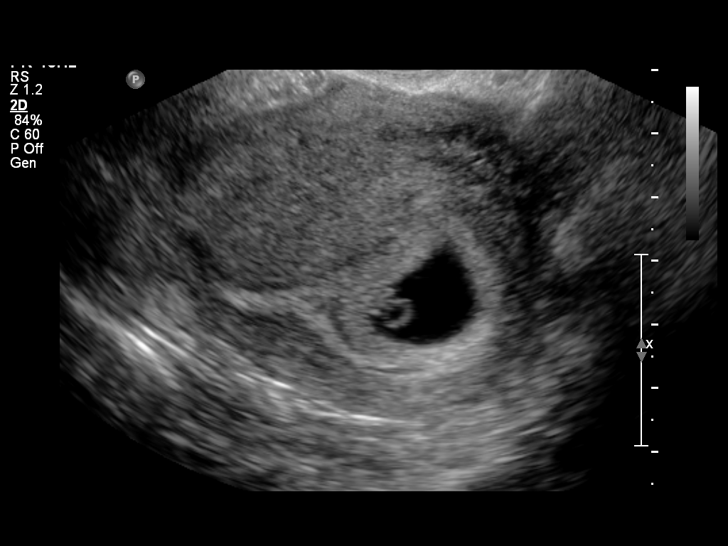
[im 7/25]
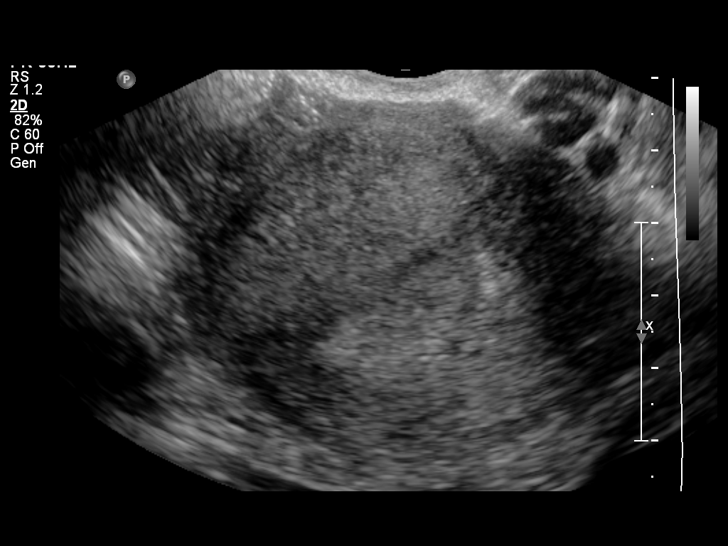
[im 9/25]
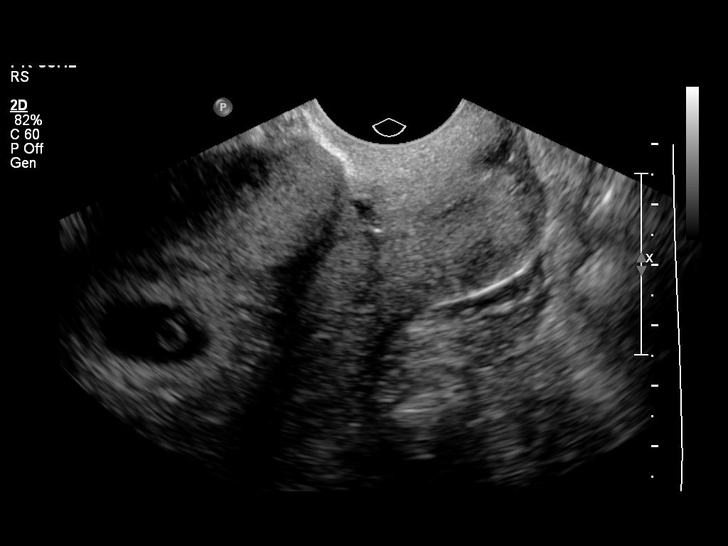
[im 11/25]
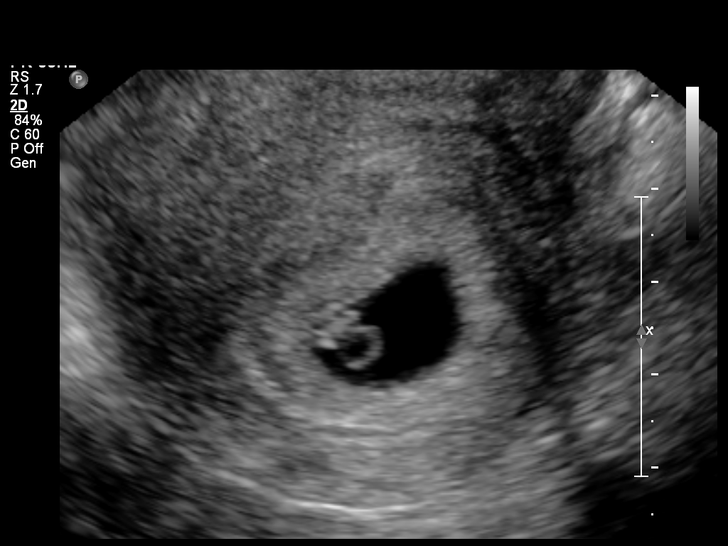
[im 13/25]
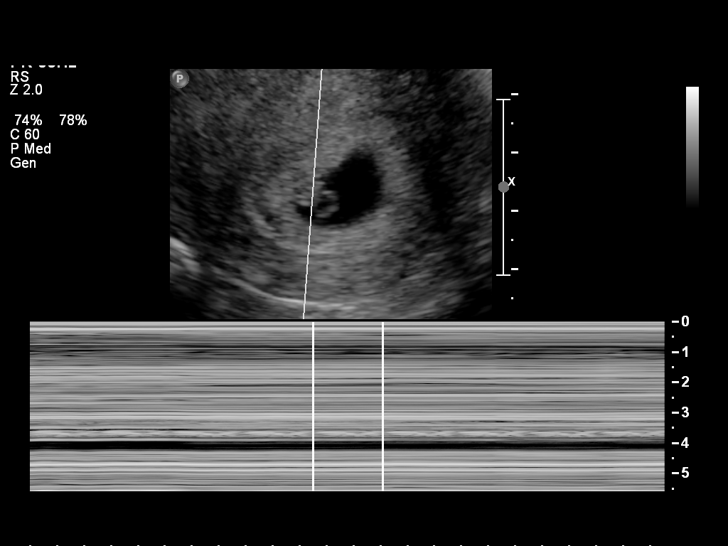
[im 15/25]
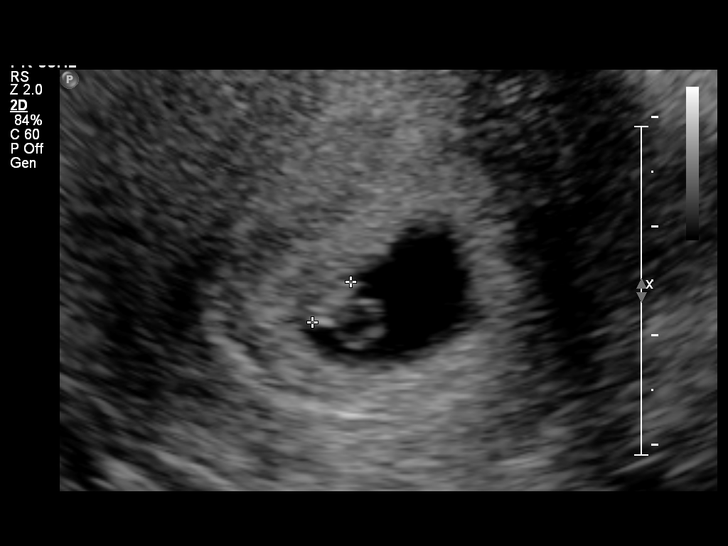
[im 17/25]
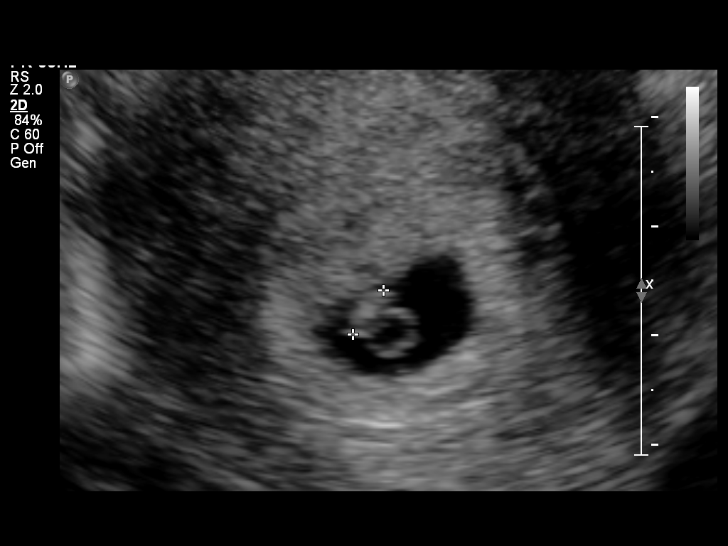
[im 19/25]
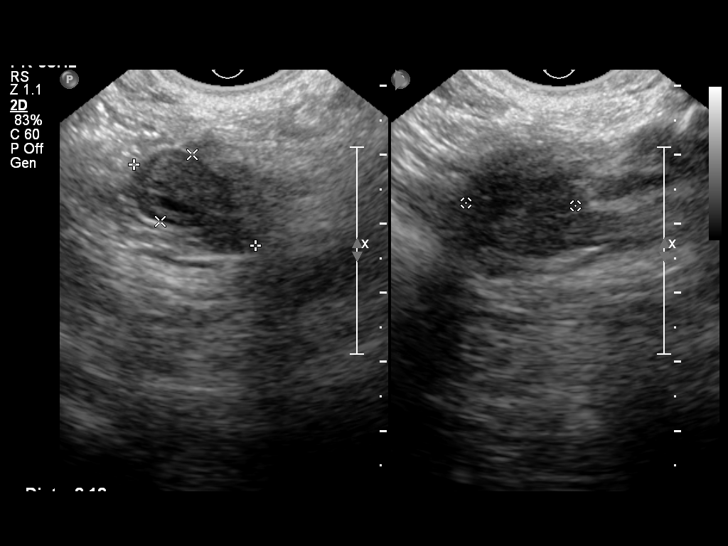
[im 21/25]
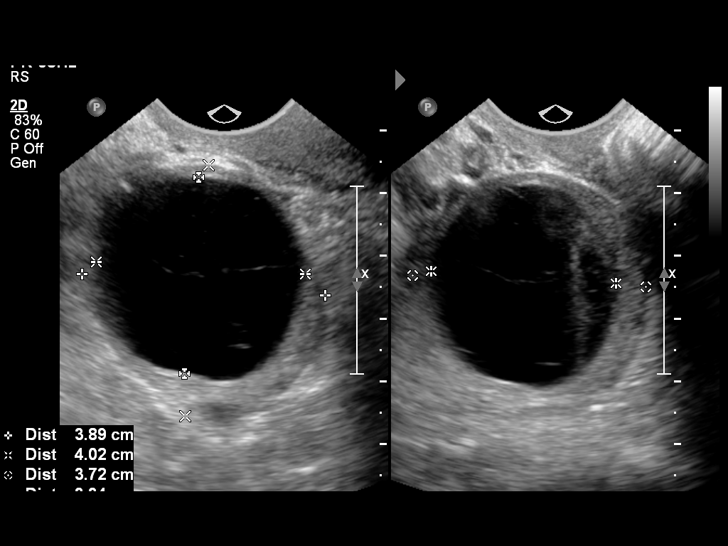
[im 23/25]
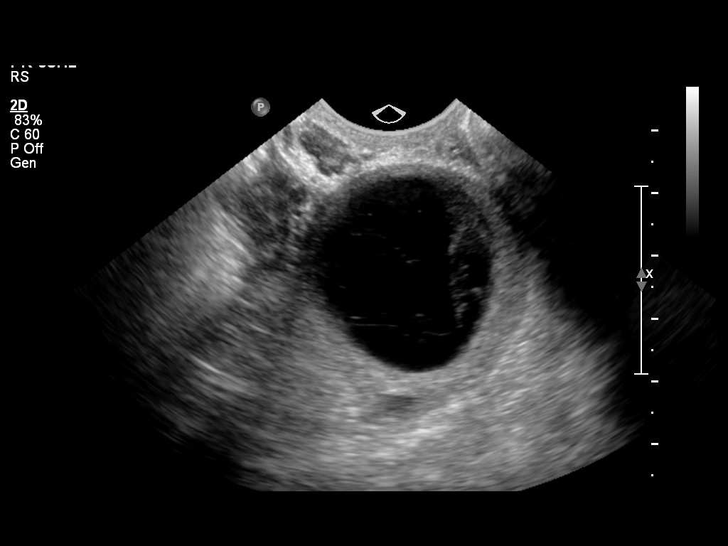
[im 25/25]
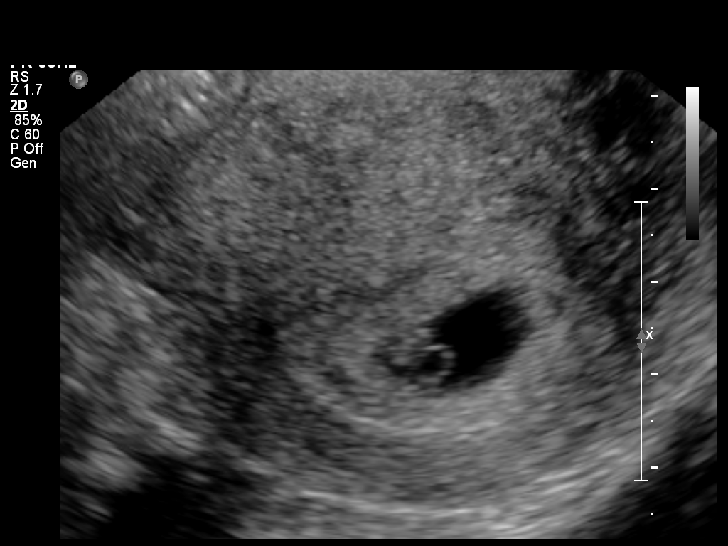

[13 of 25 positions shown; findings below may reference images not displayed]

Intrauterine gestational sac: Single; normal shape.
Yolk sac: Yes
Embryo: Yes
Cardiac Activity: Yes
Heart Rate: 116

CRL: 5.1 mm           6   w  2   d        US EDC: 04/12/2012

Subchorionic hemorrhage: No subchorionic hemorrhage is noted.

Maternal uterus/adnexae:
The uterus is otherwise unremarkable in appearance.

There appears to be a hemorrhagic cyst within the right ovary,
measuring 3.3 cm in size, similar in appearance to the prior study,
with internal lace-like echoes. The ovaries are otherwise
unremarkable in appearance.  The right ovary measures 4.0 x 3.8 x
3.7 cm, while the left ovary measures 2.1 x 1.6 x 1.1 cm.

There is no evidence for ovarian torsion.

No free fluid is seen within the pelvic cul-de-sac.
IMPRESSION: 1.  Single live intrauterine pregnancy, with a crown-rump length of
5.1 mm, corresponding to a gestational age of 6 weeks 2 days.  This
matches the gestational age of 6 weeks 4 days by LMP, reflecting an
estimated date of delivery April 10, 2012.
2.  Suspect small hemorrhagic cyst within the right ovary, stable
from the prior study.

## 2012-09-12 ENCOUNTER — Encounter (HOSPITAL_COMMUNITY): Payer: Self-pay | Admitting: Emergency Medicine

## 2012-09-12 ENCOUNTER — Emergency Department (HOSPITAL_COMMUNITY)
Admission: EM | Admit: 2012-09-12 | Discharge: 2012-09-13 | Disposition: A | Discharge status: Other Acute Inpatient Hospital | Payer: Medicaid Other | Attending: Emergency Medicine | Admitting: Emergency Medicine

## 2012-09-12 DIAGNOSIS — Z3202 Encounter for pregnancy test, result negative: Secondary | ICD-10-CM | POA: Insufficient documentation

## 2012-09-12 DIAGNOSIS — Z79899 Other long term (current) drug therapy: Secondary | ICD-10-CM | POA: Insufficient documentation

## 2012-09-12 DIAGNOSIS — Z8659 Personal history of other mental and behavioral disorders: Secondary | ICD-10-CM | POA: Insufficient documentation

## 2012-09-12 DIAGNOSIS — F329 Major depressive disorder, single episode, unspecified: Secondary | ICD-10-CM | POA: Insufficient documentation

## 2012-09-12 DIAGNOSIS — F17200 Nicotine dependence, unspecified, uncomplicated: Secondary | ICD-10-CM | POA: Insufficient documentation

## 2012-09-12 DIAGNOSIS — F411 Generalized anxiety disorder: Secondary | ICD-10-CM | POA: Insufficient documentation

## 2012-09-12 DIAGNOSIS — Z8709 Personal history of other diseases of the respiratory system: Secondary | ICD-10-CM | POA: Insufficient documentation

## 2012-09-12 DIAGNOSIS — Z8719 Personal history of other diseases of the digestive system: Secondary | ICD-10-CM | POA: Insufficient documentation

## 2012-09-12 DIAGNOSIS — F3289 Other specified depressive episodes: Secondary | ICD-10-CM | POA: Insufficient documentation

## 2012-09-12 DIAGNOSIS — F319 Bipolar disorder, unspecified: Secondary | ICD-10-CM | POA: Insufficient documentation

## 2012-09-12 DIAGNOSIS — Z8619 Personal history of other infectious and parasitic diseases: Secondary | ICD-10-CM | POA: Insufficient documentation

## 2012-09-12 LAB — CBC WITH DIFFERENTIAL/PLATELET
Basophils Absolute: 0.1 10*3/uL (ref 0.0–0.1)
Basophils Relative: 1 % (ref 0–1)
Eosinophils Absolute: 0.2 10*3/uL (ref 0.0–0.7)
Eosinophils Relative: 3 % (ref 0–5)
HCT: 41.9 % (ref 36.0–46.0)
MCH: 35.5 pg — ABNORMAL HIGH (ref 26.0–34.0)
MCHC: 35.3 g/dL (ref 30.0–36.0)
MCV: 100.5 fL — ABNORMAL HIGH (ref 78.0–100.0)
Monocytes Absolute: 0.6 10*3/uL (ref 0.1–1.0)
Platelets: 249 10*3/uL (ref 150–400)
RDW: 13 % (ref 11.5–15.5)

## 2012-09-12 LAB — BASIC METABOLIC PANEL
CO2: 28 mEq/L (ref 19–32)
Calcium: 8.8 mg/dL (ref 8.4–10.5)
Creatinine, Ser: 0.76 mg/dL (ref 0.50–1.10)
GFR calc Af Amer: >90 mL/min (ref >90)
GFR calc non Af Amer: >90 mL/min (ref >90)

## 2012-09-12 LAB — ETHANOL: Alcohol, Ethyl (B): <11 mg/dL (ref 0–11)

## 2012-09-12 MED ORDER — ONDANSETRON HCL 8 MG PO TABS: 4.0000 mg | ORAL_TABLET | Freq: Three times a day (TID) | ORAL | Status: DC | PRN

## 2012-09-12 MED ORDER — ATOMOXETINE HCL 40 MG PO CAPS
80.0000 mg | ORAL_CAPSULE | Freq: Every day | ORAL | Status: DC
Start: 2012-09-13 — End: 2012-09-13
  Administered 2012-09-13: 80 mg via ORAL
  Filled 2012-09-12: qty 2

## 2012-09-12 MED ORDER — VALACYCLOVIR HCL 500 MG PO TABS
500.0000 mg | ORAL_TABLET | Freq: Every day | ORAL | Status: DC
  Administered 2012-09-13: 500 mg via ORAL
  Filled 2012-09-12: qty 1

## 2012-09-12 MED ORDER — ZOLPIDEM TARTRATE 5 MG PO TABS: 5.0000 mg | ORAL_TABLET | Freq: Every evening | ORAL | Status: DC | PRN

## 2012-09-12 MED ORDER — PAROXETINE HCL 30 MG PO TABS
60.0000 mg | ORAL_TABLET | Freq: Every morning | ORAL | Status: DC
  Administered 2012-09-13: 60 mg via ORAL
  Filled 2012-09-12: qty 2

## 2012-09-12 MED ORDER — FUROSEMIDE 20 MG PO TABS: 40.0000 mg | ORAL_TABLET | Freq: Every day | ORAL | Status: DC | PRN

## 2012-09-12 MED ORDER — DIVALPROEX SODIUM ER 500 MG PO TB24
2000.0000 mg | ORAL_TABLET | Freq: Every day | ORAL | Status: DC
  Administered 2012-09-13: 2000 mg via ORAL
  Filled 2012-09-12: qty 4

## 2012-09-12 MED ORDER — ALUM & MAG HYDROXIDE-SIMETH 200-200-20 MG/5ML PO SUSP: 30.0000 mL | ORAL | Status: DC | PRN

## 2012-09-12 MED ORDER — NICOTINE 21 MG/24HR TD PT24
21.0000 mg | MEDICATED_PATCH | Freq: Every day | TRANSDERMAL | Status: DC
  Administered 2012-09-12 – 2012-09-13 (×2): 21 mg via TRANSDERMAL
  Filled 2012-09-12 (×2): qty 1

## 2012-09-12 MED ORDER — LORAZEPAM 1 MG PO TABS
1.0000 mg | ORAL_TABLET | Freq: Three times a day (TID) | ORAL | Status: DC | PRN
  Administered 2012-09-12: 1 mg via ORAL
  Filled 2012-09-12: qty 1

## 2012-09-12 MED ORDER — IBUPROFEN 200 MG PO TABS
600.0000 mg | ORAL_TABLET | Freq: Three times a day (TID) | ORAL | Status: DC | PRN
  Administered 2012-09-12 – 2012-09-13 (×2): 600 mg via ORAL
  Filled 2012-09-12 (×2): qty 3

## 2012-09-12 MED ORDER — POLYETHYLENE GLYCOL 3350 17 G PO PACK
17.0000 g | PACK | Freq: Every day | ORAL | Status: DC | PRN
  Filled 2012-09-12: qty 1

## 2012-09-12 NOTE — ED Notes (Signed)
Pt reports feeling increasingly anxious, pt verbalizing concerns stating "I'm afraid one of the really sick patients will come in my room to hide if they get agitated."  Pt requesting anxiety medication.

## 2012-09-12 NOTE — ED Notes (Signed)
Here for medication evaluation for bipolar unable to see Doctor at practice and referred to ED. Was placed back on medication recently Nov 2013 due to breast feeding.  Denies SI and HI states emotional due to family stress.

## 2012-09-12 NOTE — ED Notes (Signed)
Pt speaking with Telepsych.

## 2012-09-12 NOTE — ED Notes (Signed)
Pt wanded by security. 
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
## 2012-09-12 NOTE — ED Notes (Signed)
Pt states hasa an appt with "Dr. Mervyn Skeeters." who is a psychiatrist who also sees her 3 daughters. States has been off meds for 2 weeks- ran out of money, and "feel like a failure, like I am everyone's problem"

## 2012-09-12 NOTE — ED Notes (Signed)
Pt is open-minded to staying the night if she needs to be evaluated and treated here. She has four kids being seen by a psychiatrist, she is on a waiting list to see that same psychiatrist. Pt was off psych meds throughout pregnancy, restarted meds after delivery, but believes they are not working. She still feels depressed. Pt would like medication evaluation to help assess and stable her depression. Hx bipolar. A&Ox4, ambulatory, stable. Pt states she has no intention or plan to hurt herself or others. She stated she has tendency to have outbursts towards people when she is aggravated.

## 2012-09-13 LAB — PREGNANCY, URINE: Preg Test, Ur: NEGATIVE

## 2012-09-13 LAB — URINALYSIS, ROUTINE W REFLEX MICROSCOPIC
Bilirubin Urine: NEGATIVE
Glucose, UA: NEGATIVE mg/dL
Hgb urine dipstick: NEGATIVE
Protein, ur: NEGATIVE mg/dL

## 2012-09-13 LAB — RAPID URINE DRUG SCREEN, HOSP PERFORMED
Amphetamines: NOT DETECTED
Benzodiazepines: NOT DETECTED
Opiates: NOT DETECTED

## 2012-09-13 NOTE — BH Assessment (Signed)
[facility] Assessment Progress Note      Patient reviewed and accepted by Donell Sievert, PA to Dr. Daleen Bo pending an available 500 hall bed.

## 2012-09-13 NOTE — BH Assessment (Signed)
Assessment Note  Update:  Received call from [facility] stating pt accepted to Dr. Otelia Santee and pt's bed ready today at 1400.  Updated EDP Steinl and ED staff, as well as [facility], as pt accepted there pending available bed.  Updated assessment disposition, completed assessment notification,and faxed to [facility] to log.  Pt to be transported via [facility] to [facility] and this will be arranged by ED staff.   Disposition:  Disposition Disposition of Patient: Inpatient treatment program Type of inpatient treatment program: Adult (Pt accepted to [facility])  On Site Evaluation by:   Reviewed with Physician:  Valene Bors, Rennis Harding 09/13/2012 11:16 AM

## 2012-09-13 NOTE — ED Notes (Signed)
C/o headache, requesting motrin
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
## 2012-09-13 NOTE — ED Notes (Signed)
Mother called [place] stating that she had lost patient's ring on way out of ED. Ring is white metal with clear stones circling 1/2 way around band. Hallway and waiting room searched with no ring found. Security made aware.

## 2012-09-13 NOTE — ED Notes (Signed)
Patient belongings given to mother, mother brought in bag of clothing, inventoried and placed in locker 9 and 10

## 2012-09-13 NOTE — ED Provider Notes (Addendum)
Vitals normal. Nad. Alert, content.  Act eval completed, discussed pt - they indicate accepted at [facility], awaiting bed.   Suzi Roots, MD 09/13/12 1610  Suzi Roots, MD 09/13/12 [phone]  Act called, indicates pt accepted at [facility], Dr Otelia Santee.     Suzi Roots, MD 09/13/12 1105

## 2012-09-13 NOTE — BH Assessment (Signed)
[facility] Assessment Progress Note      Accepted to [facility] by Donell Sievert pending an available bed.

## 2012-09-13 NOTE — BH Assessment (Signed)
[facility] Assessment Progress Note      No beds at [facility] at this time.  Contacted Jane at [facility], no appropriate beds at this time, try again in AM ([phone]), spoke with Rosey Bath at [facility], no beds, but a few discharges in AM, faxed referral for review ([phone]), Fleet Contras at [facility] said there are available beds, but she is not sure how many they have on their review list.  She encouraged this writer to fax a referral for review now or in the morning.  Faxed referral ([phone]).

## 2012-09-13 NOTE — ED Provider Notes (Signed)
Medical screening examination/treatment/procedure(s) were performed by non-physician practitioner and as supervising physician I was immediately available for consultation/collaboration.   Shahan Starks J. Shannel Zahm, MD 09/13/12 1100 

## 2012-09-13 NOTE — ED Notes (Signed)
[facility] notified of transfer - need for pick up at 2pm- bed at [facility] will not be available until then

## 2012-09-13 NOTE — ED Notes (Signed)
Mood better, talkative, no longer crying after shower

## 2012-10-23 ENCOUNTER — Encounter (HOSPITAL_COMMUNITY): Payer: Self-pay | Admitting: Emergency Medicine

## 2012-10-23 ENCOUNTER — Emergency Department (HOSPITAL_COMMUNITY)
Admission: EM | Admit: 2012-10-23 | Discharge: 2012-10-24 | Disposition: A | Discharge status: Home / Self Care | Payer: Medicaid Other | Attending: Emergency Medicine | Admitting: Emergency Medicine

## 2012-10-23 DIAGNOSIS — IMO0002 Reserved for concepts with insufficient information to code with codable children: Secondary | ICD-10-CM | POA: Insufficient documentation

## 2012-10-23 DIAGNOSIS — Z79899 Other long term (current) drug therapy: Secondary | ICD-10-CM | POA: Insufficient documentation

## 2012-10-23 DIAGNOSIS — F17200 Nicotine dependence, unspecified, uncomplicated: Secondary | ICD-10-CM | POA: Insufficient documentation

## 2012-10-23 DIAGNOSIS — Y939 Activity, unspecified: Secondary | ICD-10-CM | POA: Insufficient documentation

## 2012-10-23 DIAGNOSIS — Z8619 Personal history of other infectious and parasitic diseases: Secondary | ICD-10-CM | POA: Insufficient documentation

## 2012-10-23 DIAGNOSIS — K219 Gastro-esophageal reflux disease without esophagitis: Secondary | ICD-10-CM | POA: Insufficient documentation

## 2012-10-23 DIAGNOSIS — F411 Generalized anxiety disorder: Secondary | ICD-10-CM | POA: Insufficient documentation

## 2012-10-23 DIAGNOSIS — Z8719 Personal history of other diseases of the digestive system: Secondary | ICD-10-CM | POA: Insufficient documentation

## 2012-10-23 DIAGNOSIS — Z8709 Personal history of other diseases of the respiratory system: Secondary | ICD-10-CM | POA: Insufficient documentation

## 2012-10-23 DIAGNOSIS — X58XXXA Exposure to other specified factors, initial encounter: Secondary | ICD-10-CM | POA: Insufficient documentation

## 2012-10-23 DIAGNOSIS — Y929 Unspecified place or not applicable: Secondary | ICD-10-CM | POA: Insufficient documentation

## 2012-10-23 DIAGNOSIS — F319 Bipolar disorder, unspecified: Secondary | ICD-10-CM | POA: Insufficient documentation

## 2012-10-23 LAB — URINALYSIS, ROUTINE W REFLEX MICROSCOPIC
Glucose, UA: NEGATIVE mg/dL
Leukocytes, UA: NEGATIVE
Nitrite: NEGATIVE
Specific Gravity, Urine: 1.028 (ref 1.005–1.030)
pH: 5.5 (ref 5.0–8.0)

## 2012-10-23 LAB — URINE MICROSCOPIC-ADD ON

## 2012-10-23 LAB — PREGNANCY, URINE: Preg Test, Ur: NEGATIVE
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
## 2012-10-23 NOTE — ED Notes (Signed)
Pt alert, arrives from home, c/o low back pain, onset was four days ago, pt ? UTI, denies s/s, denies injury, resp even unlabored, skin pwd

## 2012-10-24 ENCOUNTER — Emergency Department (HOSPITAL_COMMUNITY): Payer: Medicaid Other

## 2012-10-24 MED ORDER — CYCLOBENZAPRINE HCL 10 MG PO TABS: 10.0000 mg | ORAL_TABLET | Freq: Two times a day (BID) | ORAL | Status: DC | PRN

## 2012-10-24 MED ORDER — IBUPROFEN 400 MG PO TABS: 400.0000 mg | ORAL_TABLET | Freq: Four times a day (QID) | ORAL | Status: DC | PRN

## 2012-10-24 MED ORDER — OXYCODONE-ACETAMINOPHEN 5-325 MG PO TABS
2.0000 | ORAL_TABLET | Freq: Once | ORAL | Status: AC
  Administered 2012-10-24: 2 via ORAL
  Filled 2012-10-24: qty 2

## 2012-10-24 MED ORDER — OXYCODONE-ACETAMINOPHEN 5-325 MG PO TABS: 2.0000 | ORAL_TABLET | ORAL | Status: DC | PRN

## 2012-10-24 NOTE — ED Notes (Signed)
Patient transported to X-ray 

## 2012-10-27 NOTE — ED Provider Notes (Signed)
  Medical screening examination/treatment/procedure(s) were performed by non-physician practitioner and as supervising physician I was immediately available for consultation/collaboration.    [name], MD 10/27/12 1549

## 2013-08-20 ENCOUNTER — Encounter (HOSPITAL_COMMUNITY): Payer: Self-pay | Admitting: Emergency Medicine

## 2013-08-20 ENCOUNTER — Emergency Department (HOSPITAL_COMMUNITY): Payer: Medicaid Other

## 2013-08-20 ENCOUNTER — Emergency Department (HOSPITAL_COMMUNITY)
Admission: EM | Admit: 2013-08-20 | Discharge: 2013-08-20 | Disposition: A | Discharge status: Home / Self Care | Payer: Medicaid Other | Attending: Emergency Medicine | Admitting: Emergency Medicine

## 2013-08-20 DIAGNOSIS — K589 Irritable bowel syndrome without diarrhea: Secondary | ICD-10-CM | POA: Insufficient documentation

## 2013-08-20 DIAGNOSIS — F411 Generalized anxiety disorder: Secondary | ICD-10-CM | POA: Insufficient documentation

## 2013-08-20 DIAGNOSIS — J9819 Other pulmonary collapse: Secondary | ICD-10-CM | POA: Insufficient documentation

## 2013-08-20 DIAGNOSIS — F489 Nonpsychotic mental disorder, unspecified: Secondary | ICD-10-CM | POA: Insufficient documentation

## 2013-08-20 DIAGNOSIS — F319 Bipolar disorder, unspecified: Secondary | ICD-10-CM | POA: Insufficient documentation

## 2013-08-20 DIAGNOSIS — B009 Herpesviral infection, unspecified: Secondary | ICD-10-CM | POA: Insufficient documentation

## 2013-08-20 DIAGNOSIS — F329 Major depressive disorder, single episode, unspecified: Secondary | ICD-10-CM | POA: Insufficient documentation

## 2013-08-20 DIAGNOSIS — K219 Gastro-esophageal reflux disease without esophagitis: Secondary | ICD-10-CM | POA: Insufficient documentation

## 2013-08-20 DIAGNOSIS — IMO0002 Reserved for concepts with insufficient information to code with codable children: Secondary | ICD-10-CM | POA: Insufficient documentation

## 2013-08-20 DIAGNOSIS — F17200 Nicotine dependence, unspecified, uncomplicated: Secondary | ICD-10-CM | POA: Insufficient documentation

## 2013-08-20 DIAGNOSIS — F3289 Other specified depressive episodes: Secondary | ICD-10-CM | POA: Insufficient documentation

## 2013-08-20 MED ORDER — IBUPROFEN 800 MG PO TABS: 800.0000 mg | ORAL_TABLET | Freq: Three times a day (TID) | ORAL | Status: DC

## 2013-08-20 NOTE — ED Notes (Signed)
Back from xray
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
## 2013-08-20 NOTE — ED Notes (Signed)
Reported to Dr. Patria Maneampos.  He will come an assess the area on her rt. Lower lumbar area. Size of a dime red surrounding a scabbed area.

## 2013-08-20 NOTE — ED Notes (Signed)
Patient transported to X-ray 

## 2013-08-20 NOTE — ED Notes (Signed)
Pt stated that she had an abscess on her back that she would like lanced.  MD made aware and suture cart placed at bedside

## 2013-08-20 NOTE — Discharge Instructions (Signed)
Assault, General °Assault includes any behavior, whether intentional or reckless, which results in bodily injury to another person and/or damage to property. Included in this would be any behavior, intentional or reckless, that by its nature would be understood (interpreted) by a reasonable person as intent to harm another person or to damage his/her property. Threats may be oral or written. They may be communicated through regular mail, computer, fax, or phone. These threats may be direct or implied. °FORMS OF ASSAULT INCLUDE: °· Physically assaulting a person. This includes physical threats to inflict physical harm as well as: °· Slapping. °· Hitting. °· Poking. °· Kicking. °· Punching. °· Pushing. °· Arson. °· Sabotage. °· Equipment vandalism. °· Damaging or destroying property. °· Throwing or hitting objects. °· Displaying a weapon or an object that appears to be a weapon in a threatening manner. °· Carrying a firearm of any kind. °· Using a weapon to harm someone. °· Using greater physical size/strength to intimidate another. °· Making intimidating or threatening gestures. °· Bullying. °· Hazing. °· Intimidating, threatening, hostile, or abusive language directed toward another person. °· It communicates the intention to engage in violence against that person. And it leads a reasonable person to expect that violent behavior may occur. °· Stalking another person. °IF IT HAPPENS AGAIN: °· Immediately call for emergency help (911 in [place] °· If someone poses clear and immediate danger to you, seek legal authorities to have a protective or restraining order put in place. °· Less threatening assaults can at least be reported to authorities. °STEPS TO TAKE IF A SEXUAL ASSAULT HAS HAPPENED °· Go to an area of safety. This may include a shelter or staying with a friend. Stay away from the area where you have been attacked. A large percentage of sexual assaults are caused by a friend, relative or associate. °· If  medications were given by your caregiver, take them as directed for the full length of time prescribed. °· Only take over-the-counter or prescription medicines for pain, discomfort, or fever as directed by your caregiver. °· If you have come in contact with a sexual disease, find out if you are to be tested again. If your caregiver is concerned about the HIV/AIDS virus, he/she may require you to have continued testing for several months. °· For the protection of your privacy, test results can not be given over the phone. Make sure you receive the results of your test. If your test results are not back during your visit, make an appointment with your caregiver to find out the results. Do not assume everything is normal if you have not heard from your caregiver or the medical facility. It is important for you to follow up on all of your test results. °· File appropriate papers with authorities. This is important in all assaults, even if it has occurred in a family or by a friend. °SEEK MEDICAL CARE IF: °· You have new problems because of your injuries. °· You have problems that may be because of the medicine you are taking, such as: °· Rash. °· Itching. °· Swelling. °· Trouble breathing. °· You develop belly (abdominal) pain, feel sick to your stomach (nausea) or are vomiting. °· You begin to run a temperature. °· You need supportive care or referral to a rape crisis center. These are centers with trained personnel who can help you get through this ordeal. °SEEK IMMEDIATE MEDICAL CARE IF: °· You are afraid of being threatened, beaten, or abused. In [place] call 911. °· You   receive new injuries related to abuse. °· You develop severe pain in any area injured in the assault or have any change in your condition that concerns you. °· You faint or lose consciousness. °· You develop chest pain or shortness of breath. °Document Released: 07/25/2005 Document Revised: 10/17/2011 Document Reviewed: 03/12/2008 °ExitCare® Patient  Information ©2014 [employer]. ° ° ° ° °Emergency Department Resource Guide °1) Find a Doctor and Pay Out of Pocket °Although you won't have to find out who is covered by your insurance plan, it is a good idea to ask around and get recommendations. You will then need to call the office and see if the doctor you have chosen will accept you as a new patient and what types of options they offer for patients who are self-pay. Some doctors offer discounts or will set up payment plans for their patients who do not have insurance, but you will need to ask so you aren't surprised when you get to your appointment. ° °2) Contact Your Local Health Department °Not all health departments have doctors that can see patients for sick visits, but many do, so it is worth a call to see if yours does. If you don't know where your local health department is, you can check in your phone book. The CDC also has a tool to help you locate your state's health department, and many state websites also have listings of all of their local health departments. ° °3) Find a Walk-in Clinic °If your illness is not likely to be very severe or complicated, you may want to try a walk in clinic. These are popping up all over the country in pharmacies, drugstores, and shopping centers. They're usually staffed by nurse practitioners or physician assistants that have been trained to treat common illnesses and complaints. They're usually fairly quick and inexpensive. However, if you have serious medical issues or chronic medical problems, these are probably not your best option. ° °No Primary Care Doctor: °- Call [facility] at  832-8000 - they can help you locate a primary care doctor that  accepts your insurance, provides certain services, etc. °- Physician Referral Service- 1-800-533-3463 ° °Chronic Pain Problems: °Organization         Address  Phone   Notes  °[facility]  (336) 297-2271 Patients need to be referred by their  primary care doctor.  ° °Medication Assistance: °Organization         Address  Phone   Notes  °[place] [facility] [address] °[place], [place] [address] (336) 641-8030 --Must be a resident of [place] °-- Must have NO insurance coverage whatsoever (no Medicaid/ Medicare, etc.) °-- The pt. MUST have a primary care doctor that directs their care regularly and follows them in the community °  °[facility]  (866) 331-1348   °[employer]  (888) 892-1162   ° °Agencies that provide inexpensive medical care: °Organization         Address  Phone   Notes  °[facility]  (336) 832-8035   °[facility]    (336) 832-7272   °[facility] [address] °[place], [place] [address] (336) 832-4777   °[facility] [address], °[place] (336) 271-4999   °[facility]    (336) 373-0678   °[facility]    (336) 272-1050   °[facility] ° [address], [place] Phone:  (  336) 832-4444, Fax:  (336) 832-4440 Hours of Operation:  9 am - 6 pm, M-F.  Also accepts Medicaid/Medicare and self-pay.  °[facility] ° [address], [place] Phone: (336) 832-3150, Fax: (336) [phone]. Hours of Operation:  8:30 am - 5:30 pm, M-F.  Also accepts Medicaid and self-pay.  °[facility] [place] [address], [place] Phone: (336) 878-6027   °[facility] [address], [place], [place] (336)723-1848, Ext. 123 Mondays & Thursdays: 7-9 AM.  First 15 patients are seen on a first come, first serve basis. °  ° °Medicaid-accepting [place] Providers: ° °Organization         Address  Phone   Notes  °[facility] [address], [place] (336) 641-2100 Also accepts self-pay patients.  °[facility] [address], [place] ° (336) 856-9996   °[facility] [address], [place] (336) 288-8857   °[facility] [address] [place] [address], [place] (336) 299-7000   °Veita Bland [address], [place]  ° (336) 373-1557 Only accepts [place] Access Medicaid patients after they have their name applied to their card.  ° °Self-Pay (no insurance) in [place]: ° °Organization         Address  Phone   Notes  °Sickle Cell Patients, [facility] [address], [place] (336) 832-1970   °[facility] [address], [place] (336) 832-4400   °[name] ° [address], [place] (336) 992-4800   °[facility]/Dr. Osei-Bonsu ° [address] [place] [address], [place] or [address], [place] (336) [phone] Phone number for both [place] and [place] locations is the same.  °[facility] [address], [place] (336) 299-0000   °[facility] [address] [place] [address], [place] or [address] (336) 852-7530 °(336) 878-2260   °[facility] [address], [place] (336) 350-1642, phone; (336) 294-5005, fax Sees patients 1st and 3rd Saturday of every month.  Must not qualify for public or private insurance (i.e. Medicaid, Medicare, [place] Health Choice, Veterans' Benefits) • Household income should be no more than 200% of the poverty level •The clinic cannot treat you if you are pregnant or think you are pregnant • Sexually transmitted diseases are not treated at the clinic.  ° ° °Dental Care: °Organization         Address  Phone  Notes  °[facility] [address], [place] (336) 641-6152 Accepts children up to age 21 who are enrolled in Medicaid or [place] Health Choice; pregnant women with a Medicaid card; and children who have applied for Medicaid or [place] Health Choice, but were declined, whose parents can pay a reduced fee  at time of service.  °[facility] [place]  [address], [place] (336) 641-7733 Accepts children up to age 21 who are enrolled in Medicaid or [place] Health Choice; pregnant women with a Medicaid card; and children who have applied for Medicaid or [place] Health Choice, but were declined, whose parents can pay a reduced fee at time of service.  °[facility] ° [address], [place] (336) 641-4533 Patients are seen by appointment only. Walk-ins   are not accepted. [facility] will see patients 18 years of age and older. °Monday - Tuesday (8am-5pm) °Most Wednesdays (8:30-5pm) °$30 per visit, cash only  °[facility] ° [address], [place] (336) 641-4533 Patients are seen by appointment only. Walk-ins are not accepted. [facility] will see patients 18 years of age and older. °One Wednesday Evening (Monthly: Volunteer Based).  $30 per visit, cash only  °[facility]  (919) 537-3737 for adults; Children under age 4, call Graduate Pediatric Dentistry at (919) [phone]. Children aged 4-14, please call (919) [phone] to request a pediatric application. ° Dental services are provided in all areas of dental care including fillings, crowns and bridges, complete and partial dentures, implants, gum treatment, root canals, and extractions. Preventive care is also provided. Treatment is provided to both adults and children. °Patients are selected via a lottery and there is often a waiting list. °  °[facility] [address], °[place] ° (336) 763-8833 [email or web address] °  °[facility] [address], [place], [place] (336)723-1848, Ext. 123 Second and Fourth Thursday of each month, opens at 6:30 AM; Clinic ends at 9 AM.  Patients are seen on a first-come first-served basis, and a limited number are seen during each clinic.  ° °[facility] ° [address],  [place], [place] (336) 723-7904   Eligibility Requirements °You must have lived in [place], [place], or [place] counties for at least the last three months. °  You cannot be eligible for state or federal sponsored healthcare insurance, including Veterans Administration, Medicaid, or Medicare. °  You generally cannot be eligible for healthcare insurance through your employer.  °  How to apply: °Eligibility screenings are held every Tuesday and Wednesday afternoon from 1:00 pm until 4:00 pm. You do not need an appointment for the interview!  °[facility] [address], [place], [place] 336-631-2330   °[facility] [facility]  336-342-8273   °[facility]  336-703-3100   °[facility]  336-570-6415   ° °Behavioral Health Resources in the Community: °Intensive Outpatient Programs °Organization         Address  Phone  Notes  °[facility] [address], [place], [place] 336-878-6098   °[facility] [address], [place], [place] 336-832-9800   °[facility] [address], [place], [place] ° 336-882-2125   °[facility] [address],  °[place], [place] 1-800-853-5163 or 336-641-4981   °Substance Abuse Resources °Organization         Address  Phone  Notes  °[facility]  336-882-2125   °[facility]  336-784-9470   °[facility]  336-285-9073   °[facility]  336-845-3988   °Residential & Outpatient Substance Abuse Program  1-800-659-3381   °Psychological Services °Organization         Address  Phone  Notes  °[facility]  336- 832-9600   °[facility]  336- 378-7881   °[facility] [address], [place] 1-800-853-5163 or 336-641-4981   ° °Mobile Crisis Teams °Organization         Address  Phone  Notes  °[facility], Mobile Crisis Care Unit  1-877-626-1772   °Assertive °[facility]otherapeutic  Services ° [address] [place], [place] 336-834-9664   °Sharon DeEsch [address] °[place] 336-554-5454   ° °  Self-Help/Support Groups °Organization         Address  Phone             Notes  °[facility] [place] - variety of support groups  336- 373-1402 Call for more information  °Narcotics Anonymous (NA), [facility] [address], °[place]  2 meetings at this location  ° °Residential Treatment Programs °Organization         Address  Phone  Notes  °[facility] [address],    °[place]  1-866-801-8205   °[facility] ° [address], [place], [place] 704-293-8524   °[facility] [address], [place] 336-845-3988 Admissions: 8am-3pm M-F  °[facility] [address]    °[place], [place] 336-841-1104   °[facility] [address], [place], [place] 336-379-7146   °[facility] [address]  °[place], [place] 336-285-9073   °[facility] - Intensive Outpatient [address], [place], [place] 336-852-3033   °[facility] [address]  °[place], [place] 1-877-615-2722 or 336-784-9470   °[facility] [address] [place], [place] 336-227-7417 Accepts Medicaid  °[facility] [address]  °[place] 1-800-659-3381 Substance Abuse/Addiction Treatment  ° °[place] Behavioral Health Resources °Organization         Address  Phone  Notes  °[facility]  (888) 581-9988   °Julie Brannon, PhD [address] [place], [place]   (336) 349-5553 or (336) 951-0000   °[facility]   [address] °[place], [place] (336) 349-4454   °[facility] [address], [place], [place] (336) 342-8316 Insurance/Medicaid/sponsorship through [facility]  °[facility] [address]                                    [place], [place] (336)  342-8316 Therapy/tele-psych/case  °[facility] [address]  ° [place], [place] (336) 349-2233    °Dr. Arfeen  (336) 349-4544   °[facility] [place]  [employer] [place] [facility] 1) [address], [place] °2) [address], [place] °3)  [address], [place] (336) 349-3220 °(336) 342-7768 ° °(336) 342-8140   °[place] [facility] (336) 342-1394 or (336) 342-3537 (After Hours)    ° °  °
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
## 2014-03-19 ENCOUNTER — Other Ambulatory Visit (HOSPITAL_COMMUNITY): Payer: Self-pay | Admitting: Respiratory Therapy

## 2014-03-19 DIAGNOSIS — F17200 Nicotine dependence, unspecified, uncomplicated: Secondary | ICD-10-CM

## 2014-03-27 ENCOUNTER — Encounter (HOSPITAL_COMMUNITY): Payer: Medicaid Other

## 2014-03-28 ENCOUNTER — Encounter (HOSPITAL_COMMUNITY): Payer: Medicaid Other

## 2014-06-09 ENCOUNTER — Encounter (HOSPITAL_COMMUNITY): Payer: Self-pay | Admitting: Emergency Medicine

## 2015-09-24 ENCOUNTER — Other Ambulatory Visit: Payer: Self-pay | Admitting: Family Medicine

## 2015-09-24 DIAGNOSIS — N644 Mastodynia: Secondary | ICD-10-CM

## 2015-09-29 ENCOUNTER — Other Ambulatory Visit: Payer: Self-pay | Admitting: Family Medicine

## 2015-09-29 ENCOUNTER — Ambulatory Visit
Admission: RE | Admit: 2015-09-29 | Discharge: 2015-09-29 | Disposition: A | Discharge status: Home / Self Care | Payer: Medicaid Other | Source: Ambulatory Visit | Attending: Family Medicine | Admitting: Family Medicine

## 2015-09-29 DIAGNOSIS — N6310 Unspecified lump in the right breast, unspecified quadrant: Secondary | ICD-10-CM

## 2015-09-29 DIAGNOSIS — N644 Mastodynia: Secondary | ICD-10-CM

## 2015-10-30 ENCOUNTER — Other Ambulatory Visit: Payer: Self-pay | Admitting: General Surgery

## 2015-11-25 ENCOUNTER — Encounter (HOSPITAL_BASED_OUTPATIENT_CLINIC_OR_DEPARTMENT_OTHER): Payer: Self-pay | Admitting: *Deleted

## 2015-11-27 ENCOUNTER — Encounter (HOSPITAL_BASED_OUTPATIENT_CLINIC_OR_DEPARTMENT_OTHER)
Admission: RE | Admit: 2015-11-27 | Discharge: 2015-11-27 | Disposition: A | Discharge status: Home / Self Care | Payer: Medicaid Other | Source: Ambulatory Visit | Attending: General Surgery | Admitting: General Surgery

## 2015-11-27 ENCOUNTER — Other Ambulatory Visit: Payer: Self-pay

## 2015-11-27 DIAGNOSIS — F17200 Nicotine dependence, unspecified, uncomplicated: Secondary | ICD-10-CM | POA: Diagnosis not present

## 2015-11-27 DIAGNOSIS — F319 Bipolar disorder, unspecified: Secondary | ICD-10-CM | POA: Diagnosis not present

## 2015-11-27 DIAGNOSIS — Z8041 Family history of malignant neoplasm of ovary: Secondary | ICD-10-CM | POA: Diagnosis not present

## 2015-11-27 DIAGNOSIS — N6452 Nipple discharge: Secondary | ICD-10-CM | POA: Diagnosis not present

## 2015-11-27 LAB — BASIC METABOLIC PANEL
Anion gap: 11 (ref 5–15)
BUN: 10 mg/dL (ref 6–20)
CHLORIDE: 101 mmol/L (ref 101–111)
CO2: 27 mmol/L (ref 22–32)
Calcium: 8.6 mg/dL — ABNORMAL LOW (ref 8.9–10.3)
Creatinine, Ser: 0.68 mg/dL (ref 0.44–1.00)
GFR calc Af Amer: >60 mL/min (ref >60)
GLUCOSE: 116 mg/dL — AB (ref 65–99)
POTASSIUM: 3.8 mmol/L (ref 3.5–5.1)
Sodium: 139 mmol/L (ref 135–145)

## 2015-11-29 NOTE — H&P (Signed)
Christina [name]  [facility] Patient #: [id number] DOB: 06/02/1976 Single / Language: [place] / Race: White Female        History of Present Illness    This is a 40 year old Caucasian female, [facility] Freder[facility] HammanMichelle Collins [facility] of a left breast mass which is thought to be a benign sebaceous cyst. Dr. Windle GuardWilson Elkins is her PCP Arvil Chacoobin Gwyn from our clinic was present throughout the entire encounter as a chaperone  The patient states that for some time she has felt a lump in her left breast laterally at the areolar margin. She denies any true abscess or redness or infection but it gets larger and smaller. She states that she was told to squeeze this and she's been doing that off and on and she says the drainage material like pus or blood through the nipple. Sometimes causes pain. She has never had a breast problem before. Recent bilateral mammograms and ultrasound show a 2.7 x 1.8 x 0.5 cm hypoechoic mass just under the skin of the left outer breast, areolar margin, 3 o'clock position. It is felt that this is a sebaceous cyst or Montgomery gland. No mammographic evidence of malignancy in either breast. The patient is tired of her recurrent symptoms.  Comorbidities include bipolar disorder, some type of pulmonary problem for which she takes inhalers as needed but denies asthma. She takes Lasix intermittently for ankle swelling. She is followed by Windle GuardWilson Elkins.  Social history reveals that she is divorced. She says she has a fianc in his 6260s. She continues to smoke. She has 4 children. She does work Event organiserlandscaping and some eldercare.  Family history reveals no evidence of breast cancer. Mother had low-grade ovarian cancer is cured. Father had an MI and a stroke.  We talked about the differential diagnosis. Low probability of cancer. She was to have this excised I think we can do this through a circumareolar incision.  She'll be scheduled for excision of left breast mass, subareolar in the near future. I discussed the indications, details, techniques, numerous risk of the surgery with her. She is wearing a risk of bleeding, infection, recurrent disease, numbness of the areola or nipple, skin necrosis which she is at increased risk because of her smoking. She understands all of these issues. All of her questions were answered. She agrees with this plan.   Other Problems  Back Pain Migraine Headache  Diagnostic Studies History  Colonoscopy 5-10 years ago Mammogram within last year Pap Smear 1-5 years ago  Allergies  No Known Drug Allergies03/24/2017  Medication History Strattera (100MG  Capsule, Oral) Active. Valtrex (500MG  Tablet, Oral) Active. ProAir HFA (108 (90 Base)MCG/ACT Aerosol Soln, Inhalation) Active. Tylenol with Codeine #3 (300-30MG  Tablet, Oral) Active. Medications Reconciled  Social History  Caffeine use Carbonated beverages, Tea. No alcohol use No drug use Tobacco use Current some day smoker.  Family History  Cerebrovascular Accident Father. Cervical Cancer Mother. Heart Disease Father. Migraine Headache Brother, Daughter, Mother.  Pregnancy / Birth History  Gravida 3 Irregular periods Maternal age 40-20 Para 4    Review of Systems  General Present- Chills, Fatigue, Fever and Night Sweats. Not Present- Appetite Loss, Weight Gain and Weight Loss. Skin Not Present- Change in Wart/Mole, Dryness, Hives, Jaundice, New Lesions, Non-Healing Wounds, Rash and Ulcer. HEENT Not Present- Earache, Hearing Loss, Hoarseness, Nose Bleed, Oral Ulcers, Ringing in the Ears, Seasonal Allergies, Sinus Pain, Sore Throat, Visual Disturbances, Wears glasses/contact lenses and Yellow Eyes. Respiratory Not Present-  Bloody sputum, Chronic Cough, Difficulty Breathing, Snoring and Wheezing. Breast Present- Breast Pain and Nipple Discharge. Not Present- Breast Mass and Skin  Changes. Cardiovascular Not Present- Chest Pain, Difficulty Breathing Lying Down, Leg Cramps, Palpitations, Rapid Heart Rate, Shortness of Breath and Swelling of Extremities. Gastrointestinal Present- Constipation. Not Present- Abdominal Pain, Bloating, Bloody Stool, Change in Bowel Habits, Chronic diarrhea, Difficulty Swallowing, Excessive gas, Gets full quickly at meals, Hemorrhoids, Indigestion, Nausea, Rectal Pain and Vomiting. Female Genitourinary Not Present- Frequency, Nocturia, Painful Urination, Pelvic Pain and Urgency. Neurological Present- Headaches. Not Present- Decreased Memory, Fainting, Numbness, Seizures, Tingling, Tremor, Trouble walking and Weakness. Psychiatric Present- Bipolar. Not Present- Anxiety, Change in Sleep Pattern, Depression, Fearful and Frequent crying. Endocrine Not Present- Cold Intolerance, Excessive Hunger, Hair Changes, Heat Intolerance, Hot flashes and New Diabetes. Hematology Not Present- Easy Bruising, Excessive bleeding, Gland problems, HIV and Persistent Infections.  Vitals  Weight: 164 lb Height: 72in Body Surface Area: 1.96 m Body Mass Index: 22.24 kg/m  Temp.: 96.50F  Pulse: 86 (Regular)  BP: 122/80 (Sitting, Left Arm, Standard)       Physical Exam  General Mental Status-Alert. General Appearance-Not in acute distress. Build & Nutrition-Well nourished. Posture-Normal posture. Gait-Normal.  Head and Neck Head-normocephalic, atraumatic with no lesions or palpable masses. Trachea-midline. Thyroid Gland Characteristics - normal size and consistency and no palpable nodules.  Chest and Lung Exam Chest and lung exam reveals -on auscultation, normal breath sounds, no adventitious sounds and normal vocal resonance.  Breast Note: Left breast is smaller than right. Left areola is smaller than right. There is no infection. There is a small palpable mass under the left areola at the 3 o'clock position. This is at the  areolar margin. There is no fluid expressed to the nipple. The nipples look normal. No rash or eczema. No drainage. Doesn't seem that tender today. Consistent with a sebaceous cyst that is somewhat involuted. Feels a little bit smaller than the ultrasound findings. No axillary adenopathy. No other mass or skin changes in either breast.   Cardiovascular Cardiovascular examination reveals -normal heart sounds, regular rate and rhythm with no murmurs and femoral artery auscultation bilaterally reveals normal pulses, no bruits, no thrills.  Abdomen Inspection Inspection of the abdomen reveals - No Hernias. Palpation/Percussion Palpation and Percussion of the abdomen reveal - Soft, Non Tender, No Rigidity (guarding), No hepatosplenomegaly and No Palpable abdominal masses.  Neurologic Neurologic evaluation reveals -alert and oriented x 3 with no impairment of recent or remote memory, normal attention span and ability to concentrate, normal sensation and normal coordination.  Neuropsychiatric Note: Very talkative. Almost pressured speech. Seems to have good insight into her problems. Caries on normal conversation otherwise. Has good insight and appears competent to understand the implications of her medical decision making. Cooperative   Musculoskeletal Normal Exam - Bilateral-Upper Extremity Strength Normal and Lower Extremity Strength Normal.    Assessment & Plan   LEFT BREAST MASS (N63)  The palpable mass in your left breast is probably a benign sebaceous cyst. You report that you're able to express material out of this that looks like pus or blood and that it gets bigger and get smaller over time. Your mammograms and ultrasound are consistent with a subcutaneous sebaceous cyst You have requested that this be excised because of recurrent problems, and that is reasonable  We have discussed the indications, techniques and risks of the surgery. You are aware of the risk of numbness  of the nipple, bleeding, infection, deformity and skin necrosis. I  encourage you to stop smoking We will schedule the surgery in the near future  TOBACCO ABUSE (Z72.0) MULTIPARITY (Z64.1)   Angelia Mould. Derrell Lolling, M.D., [facility] General and Minimally invasive Surgery Breast and Colorectal Surgery Office:   [phone] Pager:   [phone]

## 2015-11-30 ENCOUNTER — Ambulatory Visit (HOSPITAL_BASED_OUTPATIENT_CLINIC_OR_DEPARTMENT_OTHER): Payer: Medicaid Other | Admitting: Anesthesiology

## 2015-11-30 ENCOUNTER — Encounter (HOSPITAL_BASED_OUTPATIENT_CLINIC_OR_DEPARTMENT_OTHER): Payer: Self-pay | Admitting: Anesthesiology

## 2015-11-30 ENCOUNTER — Ambulatory Visit (HOSPITAL_BASED_OUTPATIENT_CLINIC_OR_DEPARTMENT_OTHER)
Admission: RE | Admit: 2015-11-30 | Discharge: 2015-11-30 | Disposition: A | Discharge status: Home / Self Care | Payer: Medicaid Other | Source: Ambulatory Visit | Attending: General Surgery | Admitting: General Surgery

## 2015-11-30 ENCOUNTER — Encounter (HOSPITAL_BASED_OUTPATIENT_CLINIC_OR_DEPARTMENT_OTHER)
Admission: RE | Disposition: A | Discharge status: Home / Self Care | Payer: Self-pay | Source: Ambulatory Visit | Attending: General Surgery

## 2015-11-30 DIAGNOSIS — Z8041 Family history of malignant neoplasm of ovary: Secondary | ICD-10-CM | POA: Insufficient documentation

## 2015-11-30 DIAGNOSIS — F17200 Nicotine dependence, unspecified, uncomplicated: Secondary | ICD-10-CM | POA: Diagnosis not present

## 2015-11-30 DIAGNOSIS — F319 Bipolar disorder, unspecified: Secondary | ICD-10-CM | POA: Insufficient documentation

## 2015-11-30 DIAGNOSIS — N6452 Nipple discharge: Secondary | ICD-10-CM | POA: Diagnosis not present

## 2015-11-30 DIAGNOSIS — N6320 Unspecified lump in the left breast, unspecified quadrant: Secondary | ICD-10-CM | POA: Diagnosis present

## 2015-11-30 HISTORY — PX: MASS EXCISION: SHX2000

## 2015-11-30 HISTORY — DX: Unspecified lump in the left breast, unspecified quadrant: N63.20

## 2015-11-30 SURGERY — EXCISION MASS
Anesthesia: General | Site: Breast | Laterality: Left

## 2015-11-30 MED ORDER — MIDAZOLAM HCL 2 MG/2ML IJ SOLN
INTRAMUSCULAR | Status: AC
  Filled 2015-11-30: qty 2

## 2015-11-30 MED ORDER — HYDROCODONE-ACETAMINOPHEN 5-325 MG PO TABS
ORAL_TABLET | ORAL | Status: AC
  Filled 2015-11-30: qty 1

## 2015-11-30 MED ORDER — HYDROMORPHONE HCL 1 MG/ML IJ SOLN
0.2500 mg | INTRAMUSCULAR | Status: DC | PRN
  Administered 2015-11-30 (×2): 0.5 mg via INTRAVENOUS

## 2015-11-30 MED ORDER — CHLORHEXIDINE GLUCONATE 4 % EX LIQD: 1.0000 "application " | Freq: Once | CUTANEOUS | Status: DC

## 2015-11-30 MED ORDER — SCOPOLAMINE 1 MG/3DAYS TD PT72: 1.0000 | MEDICATED_PATCH | Freq: Once | TRANSDERMAL | Status: DC | PRN

## 2015-11-30 MED ORDER — MIDAZOLAM HCL 2 MG/2ML IJ SOLN: 1.0000 mg | INTRAMUSCULAR | Status: DC | PRN

## 2015-11-30 MED ORDER — DEXAMETHASONE SODIUM PHOSPHATE 10 MG/ML IJ SOLN
INTRAMUSCULAR | Status: AC
  Filled 2015-11-30: qty 1

## 2015-11-30 MED ORDER — CEFAZOLIN SODIUM-DEXTROSE 2-4 GM/100ML-% IV SOLN
INTRAVENOUS | Status: AC
  Filled 2015-11-30: qty 100

## 2015-11-30 MED ORDER — 0.9 % SODIUM CHLORIDE (POUR BTL) OPTIME
TOPICAL | Status: DC | PRN
  Administered 2015-11-30: 300 mL

## 2015-11-30 MED ORDER — FENTANYL CITRATE (PF) 100 MCG/2ML IJ SOLN: 50.0000 ug | INTRAMUSCULAR | Status: DC | PRN

## 2015-11-30 MED ORDER — KETOROLAC TROMETHAMINE 30 MG/ML IJ SOLN: 30.0000 mg | Freq: Once | INTRAMUSCULAR | Status: DC | PRN

## 2015-11-30 MED ORDER — LIDOCAINE HCL (CARDIAC) 20 MG/ML IV SOLN
INTRAVENOUS | Status: DC | PRN
  Administered 2015-11-30: 50 mg via INTRAVENOUS

## 2015-11-30 MED ORDER — ONDANSETRON HCL 4 MG/2ML IJ SOLN
INTRAMUSCULAR | Status: AC
  Filled 2015-11-30: qty 2

## 2015-11-30 MED ORDER — BUPIVACAINE-EPINEPHRINE 0.5% -1:200000 IJ SOLN
INTRAMUSCULAR | Status: DC | PRN
  Administered 2015-11-30: 5.5 mL

## 2015-11-30 MED ORDER — BUPIVACAINE-EPINEPHRINE (PF) 0.5% -1:200000 IJ SOLN
INTRAMUSCULAR | Status: AC
  Filled 2015-11-30: qty 30

## 2015-11-30 MED ORDER — PROPOFOL 10 MG/ML IV BOLUS
INTRAVENOUS | Status: AC
  Filled 2015-11-30: qty 40

## 2015-11-30 MED ORDER — HYDROCODONE-ACETAMINOPHEN 5-325 MG PO TABS: 1.0000 | ORAL_TABLET | Freq: Four times a day (QID) | ORAL | Status: DC | PRN

## 2015-11-30 MED ORDER — FENTANYL CITRATE (PF) 100 MCG/2ML IJ SOLN
INTRAMUSCULAR | Status: AC
  Filled 2015-11-30: qty 2

## 2015-11-30 MED ORDER — CEFAZOLIN SODIUM-DEXTROSE 2-4 GM/100ML-% IV SOLN
2.0000 g | INTRAVENOUS | Status: AC
  Administered 2015-11-30: 2 g via INTRAVENOUS

## 2015-11-30 MED ORDER — HYDROMORPHONE HCL 1 MG/ML IJ SOLN
INTRAMUSCULAR | Status: AC
  Filled 2015-11-30: qty 1

## 2015-11-30 MED ORDER — FENTANYL CITRATE (PF) 100 MCG/2ML IJ SOLN
INTRAMUSCULAR | Status: DC | PRN
  Administered 2015-11-30: 100 ug via INTRAVENOUS

## 2015-11-30 MED ORDER — HYDROCODONE-ACETAMINOPHEN 7.5-325 MG PO TABS
1.0000 | ORAL_TABLET | Freq: Once | ORAL | Status: DC | PRN
Start: 2015-11-30 — End: 2015-11-30

## 2015-11-30 MED ORDER — HYDROCODONE-ACETAMINOPHEN 5-325 MG PO TABS
1.0000 | ORAL_TABLET | Freq: Once | ORAL | Status: AC | PRN
  Administered 2015-11-30: 1 via ORAL

## 2015-11-30 MED ORDER — DEXAMETHASONE SODIUM PHOSPHATE 4 MG/ML IJ SOLN
INTRAMUSCULAR | Status: DC | PRN
  Administered 2015-11-30: 10 mg via INTRAVENOUS

## 2015-11-30 MED ORDER — PROMETHAZINE HCL 25 MG/ML IJ SOLN: 6.2500 mg | INTRAMUSCULAR | Status: DC | PRN

## 2015-11-30 MED ORDER — GLYCOPYRROLATE 0.2 MG/ML IJ SOLN: 0.2000 mg | Freq: Once | INTRAMUSCULAR | Status: DC | PRN

## 2015-11-30 MED ORDER — PROPOFOL 10 MG/ML IV BOLUS
INTRAVENOUS | Status: DC | PRN
  Administered 2015-11-30: 50 mg via INTRAVENOUS
  Administered 2015-11-30: 250 mg via INTRAVENOUS

## 2015-11-30 MED ORDER — MIDAZOLAM HCL 5 MG/5ML IJ SOLN
INTRAMUSCULAR | Status: DC | PRN
  Administered 2015-11-30: 2 mg via INTRAVENOUS

## 2015-11-30 MED ORDER — LIDOCAINE HCL (CARDIAC) 20 MG/ML IV SOLN
INTRAVENOUS | Status: AC
  Filled 2015-11-30: qty 5

## 2015-11-30 MED ORDER — SODIUM CHLORIDE 0.9 % IJ SOLN
INTRAMUSCULAR | Status: AC
  Filled 2015-11-30: qty 10

## 2015-11-30 MED ORDER — LACTATED RINGERS IV SOLN
INTRAVENOUS | Status: DC
  Administered 2015-11-30: 09:00:00 via INTRAVENOUS

## 2015-11-30 MED ORDER — ONDANSETRON HCL 4 MG/2ML IJ SOLN
INTRAMUSCULAR | Status: DC | PRN
  Administered 2015-11-30: 4 mg via INTRAVENOUS

## 2015-11-30 SURGICAL SUPPLY — 60 items
ADH SKN CLS APL DERMABOND .7 (GAUZE/BANDAGES/DRESSINGS) ×1
APL SKNCLS STERI-STRIP NONHPOA (GAUZE/BANDAGES/DRESSINGS)
BANDAGE ACE 6X5 VEL STRL LF (GAUZE/BANDAGES/DRESSINGS) IMPLANT
BENZOIN TINCTURE PRP APPL 2/3 (GAUZE/BANDAGES/DRESSINGS) IMPLANT
BINDER BREAST XLRG (GAUZE/BANDAGES/DRESSINGS) ×2 IMPLANT
BLADE HEX COATED 2.75 (ELECTRODE) ×3 IMPLANT
BLADE SURG 15 STRL LF DISP TIS (BLADE) ×2 IMPLANT
BLADE SURG 15 STRL SS (BLADE) ×3
CANISTER SUCT 1200ML W/VALVE (MISCELLANEOUS) ×3 IMPLANT
CHLORAPREP W/TINT 26ML (MISCELLANEOUS) ×3 IMPLANT
CLOSURE WOUND 1/2 X4 (GAUZE/BANDAGES/DRESSINGS)
COVER BACK TABLE 60X90IN (DRAPES) ×3 IMPLANT
COVER MAYO STAND STRL (DRAPES) ×3 IMPLANT
DECANTER SPIKE VIAL GLASS SM (MISCELLANEOUS) IMPLANT
DERMABOND ADVANCED (GAUZE/BANDAGES/DRESSINGS) ×2
DERMABOND ADVANCED .7 DNX12 (GAUZE/BANDAGES/DRESSINGS) IMPLANT
DRAPE LAPAROTOMY 100X72 PEDS (DRAPES) ×3 IMPLANT
DRAPE LAPAROTOMY TRNSV 102X78 (DRAPE) IMPLANT
DRAPE UTILITY XL STRL (DRAPES) ×3 IMPLANT
ELECT REM PT RETURN 9FT ADLT (ELECTROSURGICAL) ×3
ELECTRODE REM PT RTRN 9FT ADLT (ELECTROSURGICAL) ×1 IMPLANT
GAUZE SPONGE 4X4 16PLY XRAY LF (GAUZE/BANDAGES/DRESSINGS) IMPLANT
GLOVE BIOGEL PI IND STRL 7.0 (GLOVE) IMPLANT
GLOVE BIOGEL PI INDICATOR 7.0 (GLOVE) ×4
GLOVE ECLIPSE 6.5 STRL STRAW (GLOVE) ×2 IMPLANT
GLOVE EUDERMIC 7 POWDERFREE (GLOVE) ×3 IMPLANT
GOWN STRL REUS W/ TWL LRG LVL3 (GOWN DISPOSABLE) ×1 IMPLANT
GOWN STRL REUS W/ TWL XL LVL3 (GOWN DISPOSABLE) ×1 IMPLANT
GOWN STRL REUS W/TWL LRG LVL3 (GOWN DISPOSABLE) ×3
GOWN STRL REUS W/TWL XL LVL3 (GOWN DISPOSABLE) ×3
NDL HYPO 25X1 1.5 SAFETY (NEEDLE) ×1 IMPLANT
NEEDLE HYPO 22GX1.5 SAFETY (NEEDLE) IMPLANT
NEEDLE HYPO 25X1 1.5 SAFETY (NEEDLE) ×3 IMPLANT
NS IRRIG 1000ML POUR BTL (IV SOLUTION) ×3 IMPLANT
PACK BASIN DAY SURGERY FS (CUSTOM PROCEDURE TRAY) ×3 IMPLANT
PENCIL BUTTON HOLSTER BLD 10FT (ELECTRODE) ×3 IMPLANT
SHEET MEDIUM DRAPE 40X70 STRL (DRAPES) IMPLANT
SLEEVE SCD COMPRESS KNEE MED (MISCELLANEOUS) ×2 IMPLANT
SPONGE GAUZE 4X4 12PLY STER LF (GAUZE/BANDAGES/DRESSINGS) ×2 IMPLANT
SPONGE LAP 4X18 X RAY DECT (DISPOSABLE) ×3 IMPLANT
STAPLER VISISTAT 35W (STAPLE) IMPLANT
STRIP CLOSURE SKIN 1/2X4 (GAUZE/BANDAGES/DRESSINGS) IMPLANT
SUT ETHILON 4 0 PS 2 18 (SUTURE) IMPLANT
SUT MNCRL AB 4-0 PS2 18 (SUTURE) ×2 IMPLANT
SUT SILK 2 0 SH (SUTURE) ×1 IMPLANT
SUT VIC AB 2-0 SH 27 (SUTURE)
SUT VIC AB 2-0 SH 27XBRD (SUTURE) IMPLANT
SUT VIC AB 3-0 FS2 27 (SUTURE) IMPLANT
SUT VIC AB 4-0 P-3 18XBRD (SUTURE) IMPLANT
SUT VIC AB 4-0 P3 18 (SUTURE)
SUT VICRYL 3-0 CR8 SH (SUTURE) ×3 IMPLANT
SUT VICRYL 4-0 PS2 18IN ABS (SUTURE) IMPLANT
SYR BULB 3OZ (MISCELLANEOUS) IMPLANT
SYRINGE 10CC LL (SYRINGE) ×3 IMPLANT
TAPE HYPAFIX 4 X10 (GAUZE/BANDAGES/DRESSINGS) IMPLANT
TOWEL OR 17X24 6PK STRL BLUE (TOWEL DISPOSABLE) ×3 IMPLANT
TOWEL OR NON WOVEN STRL DISP B (DISPOSABLE) ×3 IMPLANT
TUBE CONNECTING 20'X1/4 (TUBING) ×1
TUBE CONNECTING 20X1/4 (TUBING) ×2 IMPLANT
YANKAUER SUCT BULB TIP NO VENT (SUCTIONS) ×3 IMPLANT

## 2015-11-30 NOTE — Anesthesia Procedure Notes (Signed)
Procedure Name: LMA Insertion Date/Time: 11/30/2015 9:33 AM Performed by: Genevieve NorlanderLINKA, Christina [name] Pre-anesthesia Checklist: Patient identified, Emergency Drugs available, Suction available, Patient being monitored and Timeout performed Patient Re-evaluated:Patient Re-evaluated prior to inductionOxygen Delivery Method: Circle System Utilized Preoxygenation: Pre-oxygenation with 100% oxygen Intubation Type: IV induction Ventilation: Mask ventilation without difficulty LMA: LMA inserted LMA Size: 4.0 Number of attempts: 1 Airway Equipment and Method: Bite block Placement Confirmation: positive ETCO2 Tube secured with: Tape Dental Injury: Teeth and Oropharynx as per pre-operative assessment

## 2015-11-30 NOTE — Interval H&P Note (Signed)
History and Physical Interval Note:  11/30/2015 9:05 AM  Christina SakaiJennifer D Hafley  has presented today for surgery, with the diagnosis of left breast mass  The various methods of treatment have been discussed with the patient and family. After consideration of risks, benefits and other options for treatment, the patient has consented to  Procedure(s): EXCISION LEFT BREAST  MASS (Left) as a surgical intervention .  The patient's history has been reviewed, patient examined, no change in status, stable for surgery.  I have reviewed the patient's chart and labs.  Questions were answered to the patient's satisfaction.     Ernestene MentionINGRAM,[name]

## 2015-11-30 NOTE — Transfer of Care (Signed)
Immediate Anesthesia Transfer of Care Note  Patient: Christina SakaiJennifer D [name]  Procedure(s) Performed: Procedure(s): EXCISION LEFT BREAST  MASS (Left)  Patient Location: PACU  Anesthesia Type:General  Level of Consciousness: awake and patient cooperative  Airway & Oxygen Therapy: Patient Spontanous Breathing and Patient connected to face mask oxygen  Post-op Assessment: Report given to RN and Post -op Vital signs reviewed and stable  Post vital signs: Reviewed and stable  Last Vitals:  Filed Vitals:   11/30/15 0856  BP: 101/65  Pulse: 74  Temp: 37.2 C  Resp: 20    Complications: No apparent anesthesia complications

## 2015-11-30 NOTE — Anesthesia Preprocedure Evaluation (Addendum)
Anesthesia Evaluation  Patient identified by MRN, date of birth, ID band Patient awake    Reviewed: Allergy & Precautions, NPO status , Patient's Chart, lab work & pertinent test results  Airway Mallampati: II  TM Distance: >3 FB Neck ROM: Full    Dental  (+) Dental Advisory Given   Pulmonary Current Smoker,    breath sounds clear to auscultation       Cardiovascular negative cardio ROS   Rhythm:Regular Rate:Normal     Neuro/Psych Anxiety Depression Bipolar Disorder negative neurological ROS     GI/Hepatic Neg liver ROS, GERD  ,  Endo/Other  negative endocrine ROS  Renal/GU negative Renal ROS     Musculoskeletal   Abdominal   Peds  Hematology negative hematology ROS (+)   Anesthesia Other Findings   Reproductive/Obstetrics                            Anesthesia Physical Anesthesia Plan  ASA: II  Anesthesia Plan: General   Post-op Pain Management:    Induction: Intravenous  Airway Management Planned: LMA  Additional Equipment:   Intra-op Plan:   Post-operative Plan: Extubation in OR  Informed Consent: I have reviewed the patients History and Physical, chart, labs and discussed the procedure including the risks, benefits and alternatives for the proposed anesthesia with the patient or authorized representative who has indicated his/her understanding and acceptance.   Dental advisory given  Plan Discussed with: CRNA  Anesthesia Plan Comments:         Anesthesia Quick Evaluation

## 2015-11-30 NOTE — Discharge Instructions (Signed)
[facility] °Office Phone Number [phone] ° °BREAST BIOPSY/ PARTIAL MASTECTOMY: POST OP INSTRUCTIONS ° °Always review your discharge instruction sheet given to you by the facility where your surgery was performed. ° °IF YOU HAVE DISABILITY OR FAMILY LEAVE FORMS, YOU MUST BRING THEM TO THE OFFICE FOR PROCESSING.  DO NOT GIVE THEM TO YOUR DOCTOR. ° °1. A prescription for pain medication may be given to you upon discharge.  Take your pain medication as prescribed, if needed.  If narcotic pain medicine is not needed, then you may take acetaminophen (Tylenol) or ibuprofen (Advil) as needed. °2. Take your usually prescribed medications unless otherwise directed °3. If you need a refill on your pain medication, please contact your pharmacy.  They will contact our office to request authorization.  Prescriptions will not be filled after 5pm or on week-ends. °4. You should eat very light the first 24 hours after surgery, such as soup, crackers, pudding, etc.  Resume your normal diet the day after surgery. °5. Most patients will experience some swelling and bruising in the breast.  Ice packs and a good support bra will help.  Swelling and bruising can take several days to resolve.  °6. It is common to experience some constipation if taking pain medication after surgery.  Increasing fluid intake and taking a stool softener will usually help or prevent this problem from occurring.  A mild laxative (Milk of Magnesia or Miralax) should be taken according to package directions if there are no bowel movements after 48 hours. °7. Unless discharge instructions indicate otherwise, you may remove your bandages 24-48 hours after surgery, and you may shower at that time.  You may have steri-strips (small skin tapes) in place directly over the incision.  These strips should be left on the skin for 7-10 days.  If your surgeon used skin glue on the incision, you may shower in 24 hours.  The glue will flake off over the  next 2-3 weeks.  Any sutures or staples will be removed at the office during your follow-up visit. °8. ACTIVITIES:  You may resume regular daily activities (gradually increasing) beginning the next day.  Wearing a good support bra or sports bra minimizes pain and swelling.  You may have sexual intercourse when it is comfortable. °a. You may drive when you no longer are taking prescription pain medication, you can comfortably wear a seatbelt, and you can safely maneuver your car and apply brakes. °b. RETURN TO WORK:  ______________________________________________________________________________________ °9. You should see your doctor in the office for a follow-up appointment approximately two weeks after your surgery.  Your doctor’s nurse will typically make your follow-up appointment when she calls you with your pathology report.  Expect your pathology report 2-3 business days after your surgery.  You may call to check if you do not hear from us after three days. °10. OTHER INSTRUCTIONS: _______________________________________________________________________________________________ _____________________________________________________________________________________________________________________________________ °_____________________________________________________________________________________________________________________________________ °_____________________________________________________________________________________________________________________________________ ° °WHEN TO CALL YOUR DOCTOR: °1. Fever over 101.0 °2. Nausea and/or vomiting. °3. Extreme swelling or bruising. °4. Continued bleeding from incision. °5. Increased pain, redness, or drainage from the incision. ° °The clinic staff is available to answer your questions during regular business hours.  Please don’t hesitate to call and ask to speak to one of the nurses for clinical concerns.  If you have a medical emergency, go to the nearest  emergency room or call 911.  A surgeon from [facility] is always on call at the hospital. ° °For further questions, please visit [email or web address]  ° ° ° °  Post Anesthesia Home Care Instructions ° °Activity: °Get plenty of rest for the remainder of the day. A responsible adult should stay with you for 24 hours following the procedure.  °For the next 24 hours, DO NOT: °-Drive a car °-Operate machinery °-Drink alcoholic beverages °-Take any medication unless instructed by your physician °-Make any legal decisions or sign important papers. ° °Meals: °Start with liquid foods such as gelatin or soup. Progress to regular foods as tolerated. Avoid greasy, spicy, heavy foods. If nausea and/or vomiting occur, drink only clear liquids until the nausea and/or vomiting subsides. Call your physician if vomiting continues. ° °Special Instructions/Symptoms: °Your throat may feel dry or sore from the anesthesia or the breathing tube placed in your throat during surgery. If this causes discomfort, gargle with warm salt water. The discomfort should disappear within 24 hours. ° °If you had a scopolamine patch placed behind your ear for the management of post- operative nausea and/or vomiting: ° °1. The medication in the patch is effective for 72 hours, after which it should be removed.  Wrap patch in a tissue and discard in the trash. Wash hands thoroughly with soap and water. °2. You may remove the patch earlier than 72 hours if you experience unpleasant side effects which may include dry mouth, dizziness or visual disturbances. °3. Avoid touching the patch. Wash your hands with soap and water after contact with the patch. °  ° °

## 2015-11-30 NOTE — Op Note (Signed)
Patient Name:           Christina SakaiJennifer D Hunley   Date of Surgery:        11/30/2015  Pre op Diagnosis:      Left breast mass, subareolar, non-spontaneous nipple discharge  Post op Diagnosis:    Same  Procedure:                 Excision subareolar mass left breast, lateral  Surgeon:                     Angelia MouldHaywood M. Derrell LollingIngram, M.D., FACS  Assistant:                      OR staff  Operative Indications:   This is a 40 year old Caucasian female, referred by Frederico HammanMichelle Collins [facility] for [facility] of a left breast mass which is thought to be a benign sebaceous cyst. Dr. Windle GuardWilson Elkins is her PCP      The patient states that for some time she has felt a lump in her left breast laterally at the areolar margin. She denies any true abscess or redness or infection but it gets larger and smaller. She states that she was told to squeeze this and she's been doing that off and on and she says the drainage material like pus or blood through the nipple. Sometimes causes pain. She has never had a breast problem before. Recent bilateral mammograms and ultrasound show a 2.7 x 1.8 x 0.5 cm hypoechoic mass just under the skin of the left outer breast, areolar margin, 3 o'clock position. It is felt that this is a sebaceous cyst or Montgomery gland. No mammographic evidence of malignancy in either breast. The patient is tired of her recurrent symptoms.      Comorbidities include bipolar disorder, some type of pulmonary problem for which she takes inhalers as needed but denies asthma. She takes Lasix intermittently for ankle swelling. She is followed by Windle GuardWilson Elkins.      Family history reveals no evidence of breast cancer. Mother had low-grade ovarian cancer is cured. Father had an MI and a stroke.       We talked about the differential diagnosis. Low probability of cancer. She was to have this excised I think we can do this through a circumareolar incision. She'll be scheduled for excision  of left breast mass, subareolar in the near future.   Operative Findings:       There was no evidence of infection and I could not elicit a nipple discharge.  There was thickened breast tissue in the subareolar area on the left, 3:00 position.  All of this was excised.  I took the dissection all the way up to the retro-nipple area so that the nipple ducts would be transected.  Procedure in Detail:          Following the induction of general LMA anesthesia the patient's left breast was prepped and draped in a sterile fashion.  Intravenous antibiotics were given.  Surgical timeout was performed.  0.5% Marcaine with epinephrine was used as local infiltration anesthetic.  A circumareolar incision was made laterally at the areolar margin.  Dissection was carried down into the breast tissue, medially to the subareolar area and laterally slightly.  This entire area was excised.  It felt benign.  It was sent for routine histology.  Hemostasis was excellent and achieved with electrocautery.  The wound was irrigated with saline.  The breast tissues were closed in 2 layers with interrupted 3-0 Vicryl and the skin closed with a running subcuticular 4-0 Monocryl and Dermabond.  Breast binder was placed and the patient taken to PACU in stable condition.  EBL 10 mL.  Counts correct.  Complications none.     Angelia Mould. Derrell Lolling, M.D., FACS General and Minimally Invasive Surgery Breast and Colorectal Surgery  11/30/2015 10:09 AM

## 2015-11-30 NOTE — Anesthesia Postprocedure Evaluation (Signed)
Anesthesia Post Note  Patient: Christina SakaiJennifer D [name]  Procedure(s) Performed: Procedure(s) (LRB): EXCISION LEFT BREAST  MASS (Left)  Patient location during evaluation: PACU Anesthesia Type: General Level of consciousness: awake and alert Pain management: pain level controlled Vital Signs Assessment: post-procedure vital signs reviewed and stable Respiratory status: spontaneous breathing, nonlabored ventilation and respiratory function stable Cardiovascular status: blood pressure returned to baseline and stable Postop Assessment: no signs of nausea or vomiting Anesthetic complications: no    Last Vitals:  Filed Vitals:   11/30/15 1100 11/30/15 1145  BP: 108/74 114/79  Pulse: 69 78  Temp:  36.5 C  Resp: 14 16    Last Pain:  Filed Vitals:   11/30/15 1148  PainSc: 3                  Kennieth RadFitzgerald, Azarya Oconnell E

## 2015-12-01 ENCOUNTER — Encounter (HOSPITAL_BASED_OUTPATIENT_CLINIC_OR_DEPARTMENT_OTHER): Payer: Self-pay | Admitting: General Surgery

## 2015-12-01 NOTE — Progress Notes (Signed)
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 Will discuss in detail in office next time.  hmi ______

## 2015-12-17 ENCOUNTER — Encounter: Payer: Self-pay | Admitting: Gastroenterology

## 2016-04-05 ENCOUNTER — Ambulatory Visit
Admission: RE | Admit: 2016-04-05 | Discharge: 2016-04-05 | Disposition: A | Discharge status: Home / Self Care | Payer: Medicaid Other | Source: Ambulatory Visit | Attending: Family Medicine | Admitting: Family Medicine

## 2016-04-05 ENCOUNTER — Other Ambulatory Visit: Payer: Self-pay | Admitting: Family Medicine

## 2016-04-05 DIAGNOSIS — T1490XA Injury, unspecified, initial encounter: Secondary | ICD-10-CM

## 2018-08-15 ENCOUNTER — Ambulatory Visit (HOSPITAL_COMMUNITY)
Admission: RE | Admit: 2018-08-15 | Discharge: 2018-08-15 | Disposition: A | Discharge status: Home / Self Care | Payer: Medicaid Other | Attending: Psychiatry | Admitting: Psychiatry

## 2018-08-15 ENCOUNTER — Encounter (HOSPITAL_COMMUNITY): Payer: Self-pay | Admitting: Behavioral Health

## 2018-08-15 DIAGNOSIS — F419 Anxiety disorder, unspecified: Secondary | ICD-10-CM | POA: Diagnosis not present

## 2018-08-15 DIAGNOSIS — F319 Bipolar disorder, unspecified: Secondary | ICD-10-CM | POA: Insufficient documentation
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
## 2018-08-15 NOTE — H&P (Signed)
Behavioral Health Medical Screening Exam  Christina [name] is an 43 y.o. female patient presents as a walk in at [facility] accompanied by her cousin; with complaints that the police are looking for her to serve IVC papers that her parents have taken out.  Patient denies suicidal/self-harm/homicidal ideation, psychosis, and paranoia.  Patient cousin at side states that patient has been acting normal and doesn't know why family took out IVC papers.  Patient states that she doesn't have a history of Bipolar 1 disorder but she is fine.    Total Time spent with patient: 30 minutes  Psychiatric Specialty Exam: Physical Exam  Constitutional: She is oriented to person, place, and time. She appears well-developed and well-nourished. No distress.  Neck: Normal range of motion. Neck supple.  Respiratory: Effort normal.  Musculoskeletal: Normal range of motion.  Neurological: She is alert and oriented to person, place, and time.  Skin: Skin is warm and dry.  Psychiatric: She has a normal mood and affect. Her speech is normal and behavior is normal. Judgment and thought content normal. Cognition and memory are normal.    Review of Systems  Psychiatric/Behavioral: Negative for depression, hallucinations, memory loss and suicidal ideas. Substance abuse: States that she does drink alcohol but not daily. The patient is not nervous/anxious and does not have insomnia.   All other systems reviewed and are negative.   There were no vitals taken for this visit.There is no height or weight on file to calculate BMI.  General Appearance: Casual  Eye Contact:  Good  Speech:  Clear and Coherent and Normal Rate  Volume:  Normal  Mood:  Appropriate  Affect:  Appropriate and Congruent  Thought Process:  Coherent and Goal Directed  Orientation:  Full (Time, Place, and Person)  Thought Content:  WDL and Logical  Suicidal Thoughts:  No  Homicidal Thoughts:  No  Memory:  Immediate;   Good Recent;   Good Remote;    Good  Judgement:  Intact  Insight:  Present  Psychomotor Activity:  Normal  Concentration: Concentration: Good and Attention Span: Good  Recall:  Good  Fund of Knowledge:Good  Language: Good  Akathisia:  No  Handed:  Right  AIMS (if indicated):     Assets:  Communication Skills Desire for Improvement Housing Social Support  Sleep:       Musculoskeletal: Strength & Muscle Tone: within normal limits Gait & Station: normal Patient leans: N/A  There were no vitals taken for this visit.  Recommendations:  Resources for outpatient psychiatric services  Based on my evaluation the patient does not appear to have an emergency medical condition.  Disposition: No evidence of imminent risk to self or others at present.   Patient does not meet criteria for psychiatric inpatient admission. Supportive therapy provided about ongoing stressors. Discussed crisis plan, support from social network, calling 911, coming to the Emergency Department, and calling Suicide Hotline.    Donie Lemelin, NP 08/15/2018, 5:08 PM

## 2019-09-11 ENCOUNTER — Ambulatory Visit
Admission: RE | Admit: 2019-09-11 | Discharge: 2019-09-11 | Disposition: A | Discharge status: Home / Self Care | Payer: Medicaid Other | Source: Ambulatory Visit | Attending: Family Medicine | Admitting: Family Medicine

## 2019-09-11 ENCOUNTER — Other Ambulatory Visit: Payer: Self-pay | Admitting: Family Medicine

## 2019-09-11 DIAGNOSIS — M545 Low back pain, unspecified: Secondary | ICD-10-CM

## 2020-07-06 ENCOUNTER — Encounter: Payer: Self-pay | Admitting: *Deleted

## 2020-07-08 ENCOUNTER — Other Ambulatory Visit (HOSPITAL_COMMUNITY)
Admission: RE | Admit: 2020-07-08 | Discharge: 2020-07-08 | Disposition: A | Discharge status: Home / Self Care | Payer: Medicaid Other | Source: Ambulatory Visit | Attending: Obstetrics and Gynecology | Admitting: Obstetrics and Gynecology

## 2020-07-08 ENCOUNTER — Other Ambulatory Visit: Payer: Self-pay

## 2020-07-08 ENCOUNTER — Ambulatory Visit (INDEPENDENT_AMBULATORY_CARE_PROVIDER_SITE_OTHER): Payer: Medicaid Other | Admitting: Obstetrics and Gynecology

## 2020-07-08 ENCOUNTER — Encounter: Payer: Self-pay | Admitting: Obstetrics and Gynecology

## 2020-07-08 VITALS — BP 107/75 | HR 92 | Wt 175.7 lb

## 2020-07-08 DIAGNOSIS — B977 Papillomavirus as the cause of diseases classified elsewhere: Secondary | ICD-10-CM | POA: Insufficient documentation

## 2020-07-08 DIAGNOSIS — Z01411 Encounter for gynecological examination (general) (routine) with abnormal findings: Secondary | ICD-10-CM | POA: Insufficient documentation

## 2020-07-08 DIAGNOSIS — N870 Mild cervical dysplasia: Secondary | ICD-10-CM

## 2020-07-08 DIAGNOSIS — Z1151 Encounter for screening for human papillomavirus (HPV): Secondary | ICD-10-CM | POA: Insufficient documentation

## 2020-07-08 DIAGNOSIS — Z3202 Encounter for pregnancy test, result negative: Secondary | ICD-10-CM

## 2020-07-08 LAB — POCT PREGNANCY, URINE: Preg Test, Ur: NEGATIVE

## 2020-07-08 NOTE — Progress Notes (Signed)
Colposcopy Procedure Note  Christina [name] is a 44 y.o. [id number] here for colposcopy.  Indications:  No cytology, swab with +HPV  Procedure Details  LMP 11/22/2015; UPT negative.    The risks (including infection, bleeding, pain) and benefits of the procedure were explained to the patient and written informed consent was obtained.  The patient was placed in the dorsal lithotomy position. A Graves was speculum inserted in the vagina, and the cervix was visualized.  Pap smear obtained.The cervix was stained with acetic acid and visualized using the colposcope under magnification as well as with a green filter. Findings as below. Cervical biopsies were taken at 3, 6, 9 o'clock. Endocervical curettage then performed in all four quadrants. Small amount of bleeding noted that improved with pressure. Monsel's solution applied with good hemostasis noted. Patient tolerating procedure well.  Findings: increased vascularity in posterior 180 degrees, acetowhite at 3 and 9 o'clock, friable and bled with minimal touch  Impression: low grade  Adequate: yes  Specimens:  1. Cervical biopsy 3, 6, 9 o'clock 2. Endocervical curettage  Condition: Stable  Complications: None  Plan: The patient was advised to call for any fever or for prolonged or severe pain or bleeding. She was advised to use OTC analgesics as needed for mild to moderate pain. She was advised to avoid vaginal intercourse for 48 hours or until the bleeding has completely stopped.  Will base further management on results of biopsy.   Baldemar Lenis, M.D. Attending Center for [employer] [facility])

## 2020-07-10 LAB — SURGICAL PATHOLOGY

## 2020-07-14 LAB — CYTOLOGY - PAP
Comment: NEGATIVE
Diagnosis: UNDETERMINED — AB
High risk HPV: POSITIVE — AB

## 2020-07-23 ENCOUNTER — Telehealth: Payer: Self-pay | Admitting: *Deleted

## 2020-07-23 NOTE — Telephone Encounter (Signed)
Returned patients call and gave her results of Colpo and recommendation of follow up Pap in 1 year. Advised patient to call in October to schedule for follow up Pap in December.

## 2020-07-23 NOTE — Telephone Encounter (Signed)
Pt left VM message requesting results from recent procedure. Per chart review, Dr. Earlene Plater sent a Mychart message to pt on 12/9 regarding her results which has not been reviewed by pt.

## 2023-07-10 ENCOUNTER — Other Ambulatory Visit: Payer: Self-pay

## 2023-07-10 ENCOUNTER — Encounter (HOSPITAL_BASED_OUTPATIENT_CLINIC_OR_DEPARTMENT_OTHER): Payer: Self-pay | Admitting: Orthopedic Surgery
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
## 2023-07-14 ENCOUNTER — Encounter (HOSPITAL_BASED_OUTPATIENT_CLINIC_OR_DEPARTMENT_OTHER)
Admission: RE | Admit: 2023-07-14 | Discharge: 2023-07-14 | Disposition: A | Discharge status: Home / Self Care | Payer: Medicaid Other | Source: Ambulatory Visit | Attending: Orthopedic Surgery

## 2023-07-14 DIAGNOSIS — Z01812 Encounter for preprocedural laboratory examination: Secondary | ICD-10-CM | POA: Insufficient documentation

## 2023-07-14 LAB — BASIC METABOLIC PANEL
Anion gap: 8 (ref 5–15)
BUN: 10 mg/dL (ref 6–20)
CO2: 22 mmol/L (ref 22–32)
Calcium: 8.4 mg/dL — ABNORMAL LOW (ref 8.9–10.3)
Chloride: 104 mmol/L (ref 98–111)
Creatinine, Ser: 0.78 mg/dL (ref 0.44–1.00)
GFR, Estimated: >60 mL/min (ref >60)
Glucose, Bld: 129 mg/dL — ABNORMAL HIGH (ref 70–99)
Potassium: 3.3 mmol/L — ABNORMAL LOW (ref 3.5–5.1)
Sodium: 134 mmol/L — ABNORMAL LOW (ref 135–145)

## 2023-07-18 ENCOUNTER — Encounter (HOSPITAL_BASED_OUTPATIENT_CLINIC_OR_DEPARTMENT_OTHER)
Admission: RE | Disposition: A | Discharge status: Home / Self Care | Payer: Self-pay | Source: Home / Self Care | Attending: Orthopedic Surgery

## 2023-07-18 ENCOUNTER — Ambulatory Visit (HOSPITAL_BASED_OUTPATIENT_CLINIC_OR_DEPARTMENT_OTHER): Payer: Medicaid Other | Admitting: Anesthesiology

## 2023-07-18 ENCOUNTER — Ambulatory Visit (HOSPITAL_BASED_OUTPATIENT_CLINIC_OR_DEPARTMENT_OTHER)
Admission: RE | Admit: 2023-07-18 | Discharge: 2023-07-18 | Disposition: A | Discharge status: Home / Self Care | Payer: Medicaid Other | Attending: Orthopedic Surgery | Admitting: Orthopedic Surgery

## 2023-07-18 ENCOUNTER — Other Ambulatory Visit: Payer: Self-pay

## 2023-07-18 ENCOUNTER — Encounter (HOSPITAL_BASED_OUTPATIENT_CLINIC_OR_DEPARTMENT_OTHER): Payer: Self-pay | Admitting: Orthopedic Surgery

## 2023-07-18 DIAGNOSIS — S83412A Sprain of medial collateral ligament of left knee, initial encounter: Secondary | ICD-10-CM | POA: Insufficient documentation

## 2023-07-18 DIAGNOSIS — K219 Gastro-esophageal reflux disease without esophagitis: Secondary | ICD-10-CM | POA: Insufficient documentation

## 2023-07-18 DIAGNOSIS — J45909 Unspecified asthma, uncomplicated: Secondary | ICD-10-CM | POA: Insufficient documentation

## 2023-07-18 DIAGNOSIS — S83282A Other tear of lateral meniscus, current injury, left knee, initial encounter: Secondary | ICD-10-CM | POA: Diagnosis present

## 2023-07-18 DIAGNOSIS — Z79899 Other long term (current) drug therapy: Secondary | ICD-10-CM

## 2023-07-18 DIAGNOSIS — Z01818 Encounter for other preprocedural examination: Secondary | ICD-10-CM

## 2023-07-18 DIAGNOSIS — F17210 Nicotine dependence, cigarettes, uncomplicated: Secondary | ICD-10-CM | POA: Insufficient documentation

## 2023-07-18 DIAGNOSIS — S83242A Other tear of medial meniscus, current injury, left knee, initial encounter: Secondary | ICD-10-CM | POA: Insufficient documentation

## 2023-07-18 DIAGNOSIS — X58XXXA Exposure to other specified factors, initial encounter: Secondary | ICD-10-CM | POA: Insufficient documentation

## 2023-07-18 HISTORY — PX: KNEE ARTHROSCOPY WITH MEDIAL MENISECTOMY: SHX5651

## 2023-07-18 HISTORY — DX: Unspecified asthma, uncomplicated: J45.909

## 2023-07-18 LAB — POCT PREGNANCY, URINE
Preg Test, Ur: NEGATIVE
Preg Test, Ur: NEGATIVE

## 2023-07-18 SURGERY — ARTHROSCOPY, KNEE, WITH MEDIAL MENISCECTOMY
Anesthesia: General | Site: Knee | Laterality: Left

## 2023-07-18 MED ORDER — CELECOXIB 200 MG PO CAPS
200.0000 mg | ORAL_CAPSULE | Freq: Once | ORAL | Status: AC
Start: 2023-07-18 — End: 2023-07-18
  Administered 2023-07-18: 200 mg via ORAL

## 2023-07-18 MED ORDER — CEFAZOLIN SODIUM-DEXTROSE 2-4 GM/100ML-% IV SOLN
INTRAVENOUS | Status: AC
  Filled 2023-07-18: qty 100

## 2023-07-18 MED ORDER — FENTANYL CITRATE (PF) 100 MCG/2ML IJ SOLN
INTRAMUSCULAR | Status: AC
  Filled 2023-07-18: qty 2

## 2023-07-18 MED ORDER — ACETAMINOPHEN 500 MG PO TABS
1000.0000 mg | ORAL_TABLET | Freq: Once | ORAL | Status: AC
  Administered 2023-07-18: 1000 mg via ORAL

## 2023-07-18 MED ORDER — BUPIVACAINE HCL (PF) 0.5 % IJ SOLN
INTRAMUSCULAR | Status: AC
  Filled 2023-07-18: qty 30

## 2023-07-18 MED ORDER — LACTATED RINGERS IV SOLN: INTRAVENOUS | Status: DC

## 2023-07-18 MED ORDER — DEXAMETHASONE SODIUM PHOSPHATE 10 MG/ML IJ SOLN: 8.0000 mg | Freq: Once | INTRAMUSCULAR | Status: DC

## 2023-07-18 MED ORDER — OXYCODONE HCL 5 MG PO TABS
ORAL_TABLET | ORAL | Status: AC
  Filled 2023-07-18: qty 1

## 2023-07-18 MED ORDER — ASPIRIN 81 MG PO TBEC: 81.0000 mg | DELAYED_RELEASE_TABLET | Freq: Two times a day (BID) | ORAL | 0 refills | Status: DC

## 2023-07-18 MED ORDER — SODIUM CHLORIDE 0.9 % IV SOLN: 12.5000 mg | INTRAVENOUS | Status: DC | PRN

## 2023-07-18 MED ORDER — POVIDONE-IODINE 10 % EX SWAB: 2.0000 | Freq: Once | CUTANEOUS | Status: DC

## 2023-07-18 MED ORDER — AMISULPRIDE (ANTIEMETIC) 5 MG/2ML IV SOLN: 10.0000 mg | Freq: Once | INTRAVENOUS | Status: DC | PRN

## 2023-07-18 MED ORDER — ACETAMINOPHEN 500 MG PO TABS: 1000.0000 mg | ORAL_TABLET | Freq: Once | ORAL | Status: AC

## 2023-07-18 MED ORDER — MIDAZOLAM HCL 2 MG/2ML IJ SOLN
INTRAMUSCULAR | Status: AC
  Filled 2023-07-18: qty 2

## 2023-07-18 MED ORDER — SODIUM CHLORIDE 0.9 % IR SOLN
Status: DC | PRN
  Administered 2023-07-18: 2000 mL

## 2023-07-18 MED ORDER — OXYCODONE HCL 5 MG PO TABS
5.0000 mg | ORAL_TABLET | Freq: Once | ORAL | Status: AC | PRN
  Administered 2023-07-18: 5 mg via ORAL

## 2023-07-18 MED ORDER — CELECOXIB 200 MG PO CAPS
ORAL_CAPSULE | ORAL | Status: AC
  Filled 2023-07-18: qty 1

## 2023-07-18 MED ORDER — OXYCODONE HCL 5 MG/5ML PO SOLN: 5.0000 mg | Freq: Once | ORAL | Status: AC | PRN

## 2023-07-18 MED ORDER — PROPOFOL 10 MG/ML IV BOLUS
INTRAVENOUS | Status: DC | PRN
  Administered 2023-07-18: 200 mg via INTRAVENOUS
  Administered 2023-07-18: 4 mg via INTRAVENOUS

## 2023-07-18 MED ORDER — CEFAZOLIN SODIUM-DEXTROSE 2-4 GM/100ML-% IV SOLN
2.0000 g | INTRAVENOUS | Status: AC
  Administered 2023-07-18: 2 g via INTRAVENOUS

## 2023-07-18 MED ORDER — SODIUM CHLORIDE 0.9 % IV SOLN: INTRAVENOUS | Status: DC | PRN

## 2023-07-18 MED ORDER — DIPHENHYDRAMINE HCL 50 MG/ML IJ SOLN
INTRAMUSCULAR | Status: DC | PRN
  Administered 2023-07-18: 12.5 mg via INTRAVENOUS

## 2023-07-18 MED ORDER — ONDANSETRON 4 MG PO TBDP: 4.0000 mg | ORAL_TABLET | Freq: Three times a day (TID) | ORAL | 0 refills | Status: DC | PRN

## 2023-07-18 MED ORDER — FENTANYL CITRATE (PF) 100 MCG/2ML IJ SOLN
INTRAMUSCULAR | Status: DC | PRN
  Administered 2023-07-18 (×2): 50 ug via INTRAVENOUS

## 2023-07-18 MED ORDER — HYDROCODONE-ACETAMINOPHEN 10-325 MG PO TABS: 1.0000 | ORAL_TABLET | Freq: Four times a day (QID) | ORAL | 0 refills | Status: DC | PRN

## 2023-07-18 MED ORDER — ACETAMINOPHEN 500 MG PO TABS
ORAL_TABLET | ORAL | Status: AC
  Filled 2023-07-18: qty 2

## 2023-07-18 MED ORDER — LIDOCAINE 2% (20 MG/ML) 5 ML SYRINGE
INTRAMUSCULAR | Status: DC | PRN
  Administered 2023-07-18: 60 mg via INTRAVENOUS

## 2023-07-18 MED ORDER — FENTANYL CITRATE (PF) 100 MCG/2ML IJ SOLN
25.0000 ug | INTRAMUSCULAR | Status: DC | PRN
  Administered 2023-07-18 (×2): 50 ug via INTRAVENOUS

## 2023-07-18 SURGICAL SUPPLY — 32 items
BNDG ELASTIC 6INX 5YD STR LF (GAUZE/BANDAGES/DRESSINGS) ×1 IMPLANT
CHLORAPREP W/TINT 26 (MISCELLANEOUS) ×1 IMPLANT
CUFF TRNQT CYL 34X4.125X (TOURNIQUET CUFF) ×1 IMPLANT
DISSECTOR 3.8MM X 13CM (MISCELLANEOUS) ×1 IMPLANT
DISSECTOR 4.0MM X 13CM (MISCELLANEOUS) IMPLANT
DRAPE ARTHROSCOPY W/POUCH 90 (DRAPES) ×1 IMPLANT
DRAPE IMP U-DRAPE 54X76 (DRAPES) ×1 IMPLANT
DRAPE U-SHAPE 47X51 STRL (DRAPES) ×1 IMPLANT
DRSG EMULSION OIL 3X3 NADH (GAUZE/BANDAGES/DRESSINGS) ×1 IMPLANT
EXCALIBUR 3.8MM X 13CM (MISCELLANEOUS) IMPLANT
GAUZE PAD ABD 8X10 STRL (GAUZE/BANDAGES/DRESSINGS) ×2 IMPLANT
GAUZE SPONGE 4X4 12PLY STRL (GAUZE/BANDAGES/DRESSINGS) ×1 IMPLANT
GLOVE BIO SURGEON STRL SZ7.5 (GLOVE) ×1 IMPLANT
GLOVE BIOGEL PI IND STRL 7.5 (GLOVE) ×2 IMPLANT
GLOVE BIOGEL PI IND STRL 8 (GLOVE) ×1 IMPLANT
GLOVE BIOGEL PI IND STRL 8.5 (GLOVE) IMPLANT
GLOVE SURG SYN 7.5 E (GLOVE) ×1 IMPLANT
GLOVE SURG SYN 7.5 PF PI (GLOVE) ×1 IMPLANT
GOWN STRL REUS W/ TWL LRG LVL3 (GOWN DISPOSABLE) ×1 IMPLANT
GOWN STRL REUS W/ TWL XL LVL3 (GOWN DISPOSABLE) ×1 IMPLANT
MANIFOLD NEPTUNE II (INSTRUMENTS) ×1 IMPLANT
PACK ARTHROSCOPY DSU (CUSTOM PROCEDURE TRAY) ×1 IMPLANT
PACK BASIN DAY SURGERY FS (CUSTOM PROCEDURE TRAY) ×1 IMPLANT
PACK ICE MAXI GEL EZY WRAP (MISCELLANEOUS) ×1 IMPLANT
SLEEVE SCD COMPRESS KNEE MED (STOCKING) ×1 IMPLANT
SUT ETHILON 3 0 PS 1 (SUTURE) ×1 IMPLANT
TAPE CLOTH 3X10 TAN LF (GAUZE/BANDAGES/DRESSINGS) IMPLANT
TOWEL GREEN STERILE FF (TOWEL DISPOSABLE) ×1 IMPLANT
TUBING ARTHROSCOPY IRRIG 16FT (MISCELLANEOUS) ×1 IMPLANT
WAND ABLATOR APOLLO I90 (BUR) IMPLANT
WATER STERILE IRR 1000ML POUR (IV SOLUTION) ×1 IMPLANT
WRAP KNEE MAXI GEL POST OP (GAUZE/BANDAGES/DRESSINGS) ×1 IMPLANT

## 2023-07-18 NOTE — Op Note (Signed)
07/18/2023  11:09 AM  PATIENT:  Christina [name]    PRE-OPERATIVE DIAGNOSIS:  LEFT KNEE MEDIAL AND LATERAL MENISCUS TEARS  POST-OPERATIVE DIAGNOSIS:  Same  PROCEDURE:  KNEE ARTHROSCOPY WITH LATERAL AND MEDIAL MENISECTOMIES AND INJECTION  SURGEON:  Sheral Apley, MD  ASSISTANT: Levester Fresh, PA-C, he was present and scrubbed throughout the case, critical for completion in a timely fashion, and for retraction, instrumentation, and closure.   ANESTHESIA:   General  BLOOD LOSS: min  COMPLICATIONS: None   PREOPERATIVE INDICATIONS:  [name] is a  47 y.o. female with a diagnosis of LEFT KNEE MEDIAL AND LATERAL MENISCUS TEARS who failed conservative measures and elected for surgical management.    The risks benefits and alternatives were discussed with the patient preoperatively including but not limited to the risks of infection, bleeding, nerve injury, cardiopulmonary complications, the need for revision surgery, among others, and the patient was willing to proceed.  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 After a preincinduction time out anesthesia was induced.  female received ancef for preoperative antibiotics. The left lower extremity was prepped and draped in normal sterile fashion and a pre-incision timeout was performed.   A small stab incision was made in the anterolateral portal position. The arthroscope was introduced in the joint. A medial portal was then established under direct visualization just  above the anterior horn of the medial meniscus. Diagnostic arthroscopy was then carried out with findings as described above.  I performed a partial medial and lateral meniscectomy with a shaver.   I performed a chondroplasty at the patellofemoral space and the MFC with a chaver.   The arthroscopic equipment was removed from the joint and the portals were closed with 3-0 nylon in an interrupted fashion. The knee was infiltrated with marcaine.  Sterile dressings were then applied including Xeroform 4 x 4's ABDs an ACE bandage.  The patient was then allowed to awaken from general anesthesia, transferred to the stretcher and taken to the recovery room in stable condition.  POSTOPERATIVE PLAN: The patient will be discharged home today and will followup in one week for suture removal and wound check.  VTE prophylaxis: ASA

## 2023-07-18 NOTE — Interval H&P Note (Signed)
History and Physical Interval Note:  07/18/2023 9:45 AM  Christina [name]  has presented today for surgery, with the diagnosis of LEFT KNEE MEDIAL AND LATERAL MENISCUS TEARS.  The various methods of treatment have been discussed with the patient and family. After consideration of risks, benefits and other options for treatment, the patient has consented to  Procedure(s): KNEE ARTHROSCOPY WITH LATERAL AND MEDIAL MENISECTOMIES AND INJECTION (Left) as a surgical intervention.  The patient's history has been reviewed, patient examined, no change in status, stable for surgery.  I have reviewed the patient's chart and labs.  Questions were answered to the patient's satisfaction.     Sheral Apley

## 2023-07-18 NOTE — Transfer of Care (Signed)
Immediate Anesthesia Transfer of Care Note  Patient: Christina [name]  Procedure(s) Performed: KNEE ARTHROSCOPY WITH LATERAL AND MEDIAL MENISECTOMIES AND INJECTION (Left: Knee)  Patient Location: PACU  Anesthesia Type:General  Level of Consciousness: sedated  Airway & Oxygen Therapy: Patient Spontanous Breathing and Patient connected to face mask oxygen  Post-op Assessment: Report given to RN and Post -op Vital signs reviewed and stable  Post vital signs: Reviewed and stable  Last Vitals:  Vitals Value Taken Time  BP    Temp    Pulse    Resp    SpO2      Last Pain:  Vitals:   07/18/23 0947  TempSrc: Temporal  PainSc: 5       Patients Stated Pain Goal: 3 (07/18/23 0947)  Complications: No notable events documented.

## 2023-07-18 NOTE — Anesthesia Procedure Notes (Signed)
Procedure Name: LMA Insertion Date/Time: 07/18/2023 10:40 AM  Performed by: Ronnette Hila, CRNAPre-anesthesia Checklist: Patient identified, Emergency Drugs available, Suction available and Patient being monitored Patient Re-evaluated:Patient Re-evaluated prior to induction Oxygen Delivery Method: Circle system utilized Preoxygenation: Pre-oxygenation with 100% oxygen Induction Type: IV induction Ventilation: Mask ventilation without difficulty LMA: LMA inserted LMA Size: 4.0 Number of attempts: 1 Airway Equipment and Method: Bite block Placement Confirmation: positive ETCO2 Tube secured with: Tape Dental Injury: Teeth and Oropharynx as per pre-operative assessment

## 2023-07-18 NOTE — Anesthesia Postprocedure Evaluation (Signed)
Anesthesia Post Note  Patient: Christina [name]  Procedure(s) Performed: KNEE ARTHROSCOPY WITH LATERAL AND MEDIAL MENISECTOMIES AND INJECTION (Left: Knee)     Patient location during evaluation: PACU Anesthesia Type: General Level of consciousness: awake and alert Pain management: pain level controlled Vital Signs Assessment: post-procedure vital signs reviewed and stable Respiratory status: spontaneous breathing, nonlabored ventilation and respiratory function stable Cardiovascular status: stable and blood pressure returned to baseline Anesthetic complications: no   No notable events documented.  Last Vitals:  Vitals:   07/18/23 1157 07/18/23 1230  BP: 99/68 114/74  Pulse: 68 73  Resp: 19 16  Temp:  36.6 C  SpO2: 100% 98%                  Beryle Lathe

## 2023-07-18 NOTE — Discharge Instructions (Addendum)
POST-OPERATIVE OPIOID TAPER INSTRUCTIONS: It is important to wean off of your opioid medication as soon as possible. If you do not need pain medication after your surgery it is ok to stop day one. Opioids include: Codeine, Hydrocodone(Norco, Vicodin), Oxycodone(Percocet, oxycontin) and hydromorphone amongst others.  Long term and even short term use of opiods can cause: Increased pain response Dependence Constipation Depression Respiratory depression And more.  Withdrawal symptoms can include Flu like symptoms Nausea, vomiting And more Techniques to manage these symptoms Hydrate well Eat regular healthy meals Stay active Use relaxation techniques(deep breathing, meditating, yoga) Do Not substitute Alcohol to help with tapering If you have been on opioids for less than two weeks and do not have pain than it is ok to stop all together.  Plan to wean off of opioids This plan should start within one week post op of your joint replacement. Maintain the same interval or time between taking each dose and first decrease the dose.  Cut the total daily intake of opioids by one tablet each day Next start to increase the time between doses. The last dose that should be eliminated is the evening dose.    Post Anesthesia Home Care Instructions  Activity: Get plenty of rest for the remainder of the day. A responsible individual must stay with you for 24 hours following the procedure.  For the next 24 hours, DO NOT: -Drive a car -Advertising copywriter -Drink alcoholic beverages -Take any medication unless instructed by your physician -Make any legal decisions or sign important papers.  Meals: Start with liquid foods such as gelatin or soup. Progress to regular foods as tolerated. Avoid greasy, spicy, heavy foods. If nausea and/or vomiting occur, drink only clear liquids until the nausea and/or vomiting subsides. Call your physician if vomiting continues.  Special Instructions/Symptoms: Your  throat may feel dry or sore from the anesthesia or the breathing tube placed in your throat during surgery. If this causes discomfort, gargle with warm salt water. The discomfort should disappear within 24 hours.  If you had a scopolamine patch placed behind your ear for the management of post- operative nausea and/or vomiting:  1. The medication in the patch is effective for 72 hours, after which it should be removed.  Wrap patch in a tissue and discard in the trash. Wash hands thoroughly with soap and water. 2. You may remove the patch earlier than 72 hours if you experience unpleasant side effects which may include dry mouth, dizziness or visual disturbances. 3. Avoid touching the patch. Wash your hands with soap and water after contact with the patch.     Next dose of Tylenol may be given at 3:50pm if needed.

## 2023-07-18 NOTE — Anesthesia Preprocedure Evaluation (Addendum)
Anesthesia Evaluation  Patient identified by MRN, date of birth, ID band Patient awake    Reviewed: Allergy & Precautions, NPO status , Patient's Chart, lab work & pertinent test results  History of Anesthesia Complications Negative for: history of anesthetic complications  Airway Mallampati: I  TM Distance: >3 FB Neck ROM: Full    Dental  (+) Dental Advisory Given, Partial Upper   Pulmonary asthma , Current Smoker and Patient abstained from smoking.   Pulmonary exam normal        Cardiovascular negative cardio ROS Normal cardiovascular exam     Neuro/Psych  PSYCHIATRIC DISORDERS Anxiety Depression Bipolar Disorder   negative neurological ROS     GI/Hepatic Neg liver ROS,GERD  Controlled and Medicated,,  Endo/Other   K 3.3 Na 134 Ca 8.4   Renal/GU negative Renal ROS     Musculoskeletal negative musculoskeletal ROS (+)    Abdominal   Peds  Hematology negative hematology ROS (+)   Anesthesia Other Findings HSV  Reproductive/Obstetrics                             Anesthesia Physical Anesthesia Plan  ASA: 2  Anesthesia Plan: General   Post-op Pain Management: Tylenol PO (pre-op)* and Celebrex PO (pre-op)*   Induction: Intravenous  PONV Risk Score and Plan: 2 and Treatment may vary due to age or medical condition, Ondansetron, Midazolam and Diphenhydramine  Airway Management Planned: LMA  Additional Equipment: None  Intra-op Plan:   Post-operative Plan: Extubation in OR  Informed Consent: I have reviewed the patients History and Physical, chart, labs and discussed the procedure including the risks, benefits and alternatives for the proposed anesthesia with the patient or authorized representative who has indicated his/her understanding and acceptance.     Dental advisory given  Plan Discussed with: CRNA and Anesthesiologist  Anesthesia Plan Comments:         Anesthesia Quick Evaluation

## 2023-07-19 ENCOUNTER — Encounter (HOSPITAL_BASED_OUTPATIENT_CLINIC_OR_DEPARTMENT_OTHER): Payer: Self-pay | Admitting: Orthopedic Surgery

## 2023-08-21 ENCOUNTER — Ambulatory Visit: Payer: Medicaid Other | Attending: Family Medicine | Admitting: Physical Therapy

## 2023-08-21 ENCOUNTER — Encounter: Payer: Self-pay | Admitting: Physical Therapy

## 2023-08-21 ENCOUNTER — Other Ambulatory Visit: Payer: Self-pay

## 2023-08-21 DIAGNOSIS — M25562 Pain in left knee: Secondary | ICD-10-CM | POA: Insufficient documentation

## 2023-08-21 DIAGNOSIS — M23307 Other meniscus derangements, unspecified meniscus, left knee: Secondary | ICD-10-CM | POA: Diagnosis present
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
## 2023-08-28 ENCOUNTER — Ambulatory Visit: Payer: Medicaid Other | Admitting: Physical Therapy

## 2023-08-28 ENCOUNTER — Encounter: Payer: Self-pay | Admitting: Physical Therapy

## 2023-08-28 DIAGNOSIS — M23307 Other meniscus derangements, unspecified meniscus, left knee: Secondary | ICD-10-CM

## 2023-08-28 DIAGNOSIS — M25562 Pain in left knee: Secondary | ICD-10-CM | POA: Diagnosis not present
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
## 2023-08-31 ENCOUNTER — Ambulatory Visit: Payer: Medicaid Other

## 2023-08-31 DIAGNOSIS — M25562 Pain in left knee: Secondary | ICD-10-CM

## 2023-08-31 DIAGNOSIS — M23307 Other meniscus derangements, unspecified meniscus, left knee: Secondary | ICD-10-CM

## 2023-08-31 NOTE — Therapy (Signed)
OUTPATIENT PHYSICAL THERAPY TREATMENT NOTE   Patient Name: Christina [name] MRN: [id number] DOB:1975/11/04, 48 y.o., female Today's Date: 08/31/2023  END OF SESSION:  PT End of Session - 08/31/23 0935     Visit Number 3    Number of Visits 13    Date for PT Re-Evaluation 10/02/23    PT Start Time 0934    PT Stop Time 1000    PT Time Calculation (min) 26 min    Activity Tolerance Patient tolerated treatment well    Behavior During Therapy [facility] for tasks assessed/performed               Past Medical History:  Diagnosis Date   Anxiety    Asthma    Bipolar 1 disorder (HCC)    no meds with pregnancy   Collapsed lung 08/08/2006   from [facility]   Depression    GERD (gastroesophageal reflux disease)    tums prn   HSV infection    IBS (irritable bowel syndrome)    Left breast mass 11/30/2015   Mental disorder    Past Surgical History:  Procedure Laterality Date   CESAREAN SECTION  [id number]   KNEE ARTHROSCOPY WITH MEDIAL MENISECTOMY Left 07/18/2023   Procedure: KNEE ARTHROSCOPY WITH LATERAL AND MEDIAL MENISECTOMIES AND INJECTION;  Surgeon: Sheral Apley, MD;  Location: [facility];  Service: Orthopedics;  Laterality: Left;   MASS EXCISION Left 11/30/2015   Procedure: EXCISION LEFT BREAST  MASS;  Surgeon: Claud Kelp, MD;  Location: [facility];  Service: General;  Laterality: Left;   TONSILLECTOMY     TUBAL LIGATION     Patient Active Problem List   Diagnosis Date Noted   Left breast mass 11/30/2015   Pyelonephritis 06/30/2012   Right flank pain 06/30/2012   Bipolar 1 disorder (HCC) 06/30/2012   GERD (gastroesophageal reflux disease) 06/30/2012   Tobacco abuse 06/30/2012   IRRITABLE BOWEL, PREDOMINANTLY CONSTIPATION 09/22/2008   ABDOMINAL PAIN, PERIUMBILIC 09/17/2008    PCP: Kaleen Mask, MD   REFERRING PROVIDER: Sheral Apley, MD  REFERRING DIAG: No attached dx to referral. (Knee scope following meniscus tear from  previous encounter)  THERAPY DIAG:  Meniscus degeneration, left  Acute pain of left knee  Rationale for Evaluation and Treatment: Rehabilitation  ONSET DATE: 4 weeks ago  SUBJECTIVE:   SUBJECTIVE STATEMENT: Patient reports that her knee is feeling stiff. She did a lot of cleaning yesterday which increased her pain.    PERTINENT HISTORY: L Knee scope and meniscus repair  PAIN:  Are you having pain? Yes: NPRS scale: 8/10 Pain location: Anterior knee Pain description: sharp  Aggravating factors: knee extension Relieving factors: bending the knee, rest  PRECAUTIONS: None  RED FLAGS: None   WEIGHT BEARING RESTRICTIONS: No  FALLS:  Has patient fallen in last 6 months? Yes, fall after concert causing the knee injury  LIVING ENVIRONMENT: Lives with: lives with their family Lives in: House/apartment  PLOF: Independent  PATIENT GOALS: Be able to keep up with son, Get back to PLOF  NEXT MD VISIT: "following up after PT is done"   OBJECTIVE:  Note: Objective measures were completed at Evaluation unless otherwise noted.  DIAGNOSTIC FINDINGS: N/A  PATIENT SURVEYS:  FOTO 54% (anticipated 65%)  COGNITION: Overall cognitive status: Within functional limits for tasks assessed     SENSATION: WFL  EDEMA:  Minimal edema on anterolateral L knee  POSTURE: rounded shoulders and forward head  PALPATION: TTP at quadriceps tendon, and  high level of pain with palpation to lateral epicondyle of femur  LOWER EXTREMITY ROM:  Active ROM Right eval Left eval  Hip flexion    Hip extension    Hip abduction    Hip adduction    Hip internal rotation    Hip external rotation    Knee flexion 140 110  Knee extension 0 -30  Ankle dorsiflexion    Ankle plantarflexion    Ankle inversion    Ankle eversion     (Blank rows = not tested)  LOWER EXTREMITY MMT:  MMT Right eval Left eval  Hip flexion 4 3+  Hip extension    Hip abduction    Hip adduction    Hip internal  rotation    Hip external rotation    Knee flexion 5 3+  Knee extension 5 3+  Ankle dorsiflexion    Ankle plantarflexion    Ankle inversion    Ankle eversion     (Blank rows = not tested)   GAIT: Distance walked: 62ft Assistive device utilized: None Level of assistance: Complete Independence Comments: Flexed L knee during stance  [facility] Adult PT Treatment:                                                DATE: 08/31/23 Therapeutic Exercise: Recumbent bike 5' no resistance Banded TKE 2x12, red Supine SLR LLE 2x10 Therapeutic activity: Discussion of importance of completing HEP, activity pacing, decreasing fear around moving the knee, not sleeping with pillow under the knee    [facility] Adult PT Treatment:                                                DATE:  08/28/2023 (pt attended late, time allotted for FOTO) Therapeutic Exercise: Recumbent bike 5' Banded TKE 3x12, red Banded abduction, standing, 2x12, red  Therapeutic Activity: STS no UE assist, 2x12 Tandem stance with AP weight shifts                                                                                                                            TREATMENT DATE: 08/21/2023 Therapeutic activity:  -Reviewed HEP for at home application     PATIENT EDUCATION:  Education details: Pt received education regarding HEP performance, ADL performance, functional activity tolerance, impairment education, appropriate performance of therapeutic activities. Person educated: Patient Education method: Explanation, Demonstration, Tactile cues, Verbal cues, and Handouts Education comprehension: verbalized understanding and returned demonstration  HOME EXERCISE PROGRAM: Access Code: [email or web address] URL: [email or web address] Date: 08/21/2023 Prepared by: Sheliah Plane  Exercises - Supine Quad Set  - 1 x daily - 6 x weekly - 2-3 sets - 10 reps - 5-8s hold - Supine Heel  Slides  - 1 x daily - 4 x weekly - 2-3 sets - 12 reps -  Seated Passive Knee Extension  - 1 x daily - 7 x weekly - 2 sets - 1 reps - 3-54m hold - Supine Straight Leg Raises  - 1 x daily - 4 x weekly - 2-3 sets - 10 reps - 2s hold  ASSESSMENT:  CLINICAL IMPRESSION: Patient presents to PT reporting continued soreness and stiffness in her knee and that she did a lot of cleaning yesterday which exacerbated her pain. Session today truncated appropriately as patient arrived late, able to complete all prescribed exercises without issue. She responds well to positive encouragement throughout session as she is fearful of some exercises and moving the knee. Patient continues to benefit from skilled PT services and should be progressed as able to improve functional independence.    Eval Impression (08/21/2023):Pt. attended today's physical therapy session for evaluation of L knee s/p Meniscus scope. Pt has complaints of knee pain with all mobility and decreased function with ADLs. Pt has notable deficits with LLE strength and tolerance to end range mobility, as well as abnormal gait pattern. Pt would benefit from therapeutic focus on increasing tolerance to end range mobility, strength within full ROM, and increasing tolerance to standing activities and ambulation with good quality.  Pt demonstrated great understanding of education provided and required moderate cues for appropriate performance with today's activities. Pt requires the intervention of skilled outpatient physical therapy to address the aforementioned deficits and progress towards a functional level in line with therapeutic goals.   OBJECTIVE IMPAIRMENTS: Abnormal gait, decreased endurance, decreased mobility, difficulty walking, decreased ROM, decreased strength, increased edema, and pain.   ACTIVITY LIMITATIONS: carrying, lifting, bending, stairs, locomotion level, and caring for others  PARTICIPATION LIMITATIONS: interpersonal relationship, community activity, and occupation  PERSONAL FACTORS: Time  since onset of injury/illness/exacerbation are also affecting patient's functional outcome.   REHAB POTENTIAL: Good  CLINICAL DECISION MAKING: Stable/uncomplicated  EVALUATION COMPLEXITY: Low   GOALS: Goals reviewed with patient? Yes  SHORT TERM GOALS: Target date: 09/11/2023  Pt will improve MMT score for Global knee strength to a 4/5 to demonstrate improvement in strength for quality of motion and activity performance. Baseline:3+ Goal status: INITIAL  2.  Pt will be independent with administered HEP to demonstrate the competency necessary for long term managemnet of symptoms at home. Baseline: SBA Goal status: INITIAL  LONG TERM GOALS: Target date: 10/02/2023  Pt will ambulate 152ft demonstrating 0 degrees of knee extension during stance with less than 2/10 pain as to improve ambulation quality and functional ability with ADLs  Baseline: -30 degrees, 8/10 pain Goal status: INITIAL  2.  Pt. Will achieve a FOTO score of 65% as to demonstrate improvement in self-perceived functional ability with daily activities. Baseline: 54% Goal status: INITIAL  3.  Pt will improve AROM of L knee to 0-130 as to demonstrate improved mobility and active function in line with PLOF Baseline:  Goal status: INITIAL   PLAN:  PT FREQUENCY: 2x/week  PT DURATION: 6 weeks  PLANNED INTERVENTIONS: 97110-Therapeutic exercises, 97530- Therapeutic activity, 97112- Neuromuscular re-education, 97535- Self Care, [id number]- Manual therapy, [phone]- Gait training, Balance training, Stair training, and Joint mobilization  PLAN FOR NEXT SESSION: Review HEP, Quadriceps recruitment, TKE in stance, standing tolerance.   Berta Minor PTA 08/31/2023, 9:59 AM

## 2023-09-04 ENCOUNTER — Ambulatory Visit: Payer: Medicaid Other | Admitting: Physical Therapy

## 2023-09-04 NOTE — Therapy (Incomplete)
OUTPATIENT PHYSICAL THERAPY TREATMENT NOTE   Patient Name: Christina [name] MRN: [id number] DOB:02/18/1976, 48 y.o., female Today's Date: 09/04/2023  END OF SESSION:      Past Medical History:  Diagnosis Date   Anxiety    Asthma    Bipolar 1 disorder (HCC)    no meds with pregnancy   Collapsed lung 08/08/2006   from [facility]vc   Depression    GERD (gastroesophageal reflux disease)    tums prn   HSV infection    IBS (irritable bowel syndrome)    Left breast mass 11/30/2015   Mental disorder    Past Surgical History:  Procedure Laterality Date   CESAREAN SECTION  [id number]   KNEE ARTHROSCOPY WITH MEDIAL MENISECTOMY Left 07/18/2023   Procedure: KNEE ARTHROSCOPY WITH LATERAL AND MEDIAL MENISECTOMIES AND INJECTION;  Surgeon: Sheral Apley, MD;  Location: [facility];  Service: Orthopedics;  Laterality: Left;   MASS EXCISION Left 11/30/2015   Procedure: EXCISION LEFT BREAST  MASS;  Surgeon: Claud Kelp, MD;  Location: [facility];  Service: General;  Laterality: Left;   TONSILLECTOMY     TUBAL LIGATION     Patient Active Problem List   Diagnosis Date Noted   Left breast mass 11/30/2015   Pyelonephritis 06/30/2012   Right flank pain 06/30/2012   Bipolar 1 disorder (HCC) 06/30/2012   GERD (gastroesophageal reflux disease) 06/30/2012   Tobacco abuse 06/30/2012   IRRITABLE BOWEL, PREDOMINANTLY CONSTIPATION 09/22/2008   ABDOMINAL PAIN, PERIUMBILIC 09/17/2008    PCP: Kaleen Mask, MD   REFERRING PROVIDER: Sheral Apley, MD  REFERRING DIAG: No attached dx to referral. (Knee scope following meniscus tear from previous encounter)  THERAPY DIAG:  No diagnosis found.  Rationale for Evaluation and Treatment: Rehabilitation  ONSET DATE: 4 weeks ago  SUBJECTIVE:   SUBJECTIVE STATEMENT: ***   PERTINENT HISTORY: L Knee scope and meniscus repair  PAIN:  Are you having pain? Yes: NPRS scale: 8/10 Pain location: Anterior  knee Pain description: sharp  Aggravating factors: knee extension Relieving factors: bending the knee, rest  PRECAUTIONS: None  RED FLAGS: None   WEIGHT BEARING RESTRICTIONS: No  FALLS:  Has patient fallen in last 6 months? Yes, fall after concert causing the knee injury  LIVING ENVIRONMENT: Lives with: lives with their family Lives in: House/apartment  PLOF: Independent  PATIENT GOALS: Be able to keep up with son, Get back to PLOF  NEXT MD VISIT: "following up after PT is done"   OBJECTIVE:  Note: Objective measures were completed at Evaluation unless otherwise noted.  DIAGNOSTIC FINDINGS: N/A  PATIENT SURVEYS:  FOTO 54% (anticipated 65%)  COGNITION: Overall cognitive status: Within functional limits for tasks assessed     SENSATION: WFL  EDEMA:  Minimal edema on anterolateral L knee  POSTURE: rounded shoulders and forward head  PALPATION: TTP at quadriceps tendon, and high level of pain with palpation to lateral epicondyle of femur  LOWER EXTREMITY ROM:  Active ROM Right eval Left eval  Hip flexion    Hip extension    Hip abduction    Hip adduction    Hip internal rotation    Hip external rotation    Knee flexion 140 110  Knee extension 0 -30  Ankle dorsiflexion    Ankle plantarflexion    Ankle inversion    Ankle eversion     (Blank rows = not tested)  LOWER EXTREMITY MMT:  MMT Right eval Left eval  Hip flexion 4 3+  Hip extension    Hip abduction    Hip adduction    Hip internal rotation    Hip external rotation    Knee flexion 5 3+  Knee extension 5 3+  Ankle dorsiflexion    Ankle plantarflexion    Ankle inversion    Ankle eversion     (Blank rows = not tested)   GAIT: Distance walked: 42ft Assistive device utilized: None Level of assistance: Complete Independence Comments: Flexed L knee during stance   [facility] Adult PT Treatment:                                                DATE: 09/04/2023 Therapeutic  Exercise: Recumbent bike 5' no resistance Banded TKE 2x12, red Supine SLR LLE 2x10  Therapeutic Activity: STS no UE assist Banded Monster walk, Red 2x15ft   [facility] Adult PT Treatment:                                                DATE: 08/31/23 Therapeutic Exercise: Recumbent bike 5' no resistance Banded TKE 2x12, red Supine SLR LLE 2x10 Therapeutic activity: Discussion of importance of completing HEP, activity pacing, decreasing fear around moving the knee, not sleeping with pillow under the knee  PATIENT EDUCATION:  Education details: Pt received education regarding HEP performance, ADL performance, functional activity tolerance, impairment education, appropriate performance of therapeutic activities. Person educated: Patient Education method: Explanation, Demonstration, Tactile cues, Verbal cues, and Handouts Education comprehension: verbalized understanding and returned demonstration  HOME EXERCISE PROGRAM: Access Code: [email or web address] URL: [email or web address] Date: 08/21/2023 Prepared by: Sheliah Plane  Exercises - Supine Quad Set  - 1 x daily - 6 x weekly - 2-3 sets - 10 reps - 5-8s hold - Supine Heel Slides  - 1 x daily - 4 x weekly - 2-3 sets - 12 reps - Seated Passive Knee Extension  - 1 x daily - 7 x weekly - 2 sets - 1 reps - 3-69m hold - Supine Straight Leg Raises  - 1 x daily - 4 x weekly - 2-3 sets - 10 reps - 2s hold  ASSESSMENT:  CLINICAL IMPRESSION: Pt attended physical therapy session for continuation of treatment regarding ***. Pt showed  *** tolerance to treatment and demonstrated improvement with ***. Some difficulties ***. Continue therapuetic focus on *** Pt required *** for safe and appropriate performance of ***    Eval Impression (08/21/2023):Pt. attended today's physical therapy session for evaluation of L knee s/p Meniscus scope. Pt has complaints of knee pain with all mobility and decreased function with ADLs. Pt has notable deficits with  LLE strength and tolerance to end range mobility, as well as abnormal gait pattern. Pt would benefit from therapeutic focus on increasing tolerance to end range mobility, strength within full ROM, and increasing tolerance to standing activities and ambulation with good quality.  Pt demonstrated great understanding of education provided and required moderate cues for appropriate performance with today's activities. Pt requires the intervention of skilled outpatient physical therapy to address the aforementioned deficits and progress towards a functional level in line with therapeutic goals.   OBJECTIVE IMPAIRMENTS: Abnormal gait, decreased endurance, decreased mobility, difficulty walking, decreased ROM, decreased strength, increased edema, and pain.  ACTIVITY LIMITATIONS: carrying, lifting, bending, stairs, locomotion level, and caring for others  PARTICIPATION LIMITATIONS: interpersonal relationship, community activity, and occupation  PERSONAL FACTORS: Time since onset of injury/illness/exacerbation are also affecting patient's functional outcome.   REHAB POTENTIAL: Good  CLINICAL DECISION MAKING: Stable/uncomplicated  EVALUATION COMPLEXITY: Low   GOALS: Goals reviewed with patient? Yes  SHORT TERM GOALS: Target date: 09/11/2023  Pt will improve MMT score for Global knee strength to a 4/5 to demonstrate improvement in strength for quality of motion and activity performance. Baseline:3+ Goal status: INITIAL  2.  Pt will be independent with administered HEP to demonstrate the competency necessary for long term managemnet of symptoms at home. Baseline: SBA Goal status: INITIAL  LONG TERM GOALS: Target date: 10/02/2023  Pt will ambulate 111ft demonstrating 0 degrees of knee extension during stance with less than 2/10 pain as to improve ambulation quality and functional ability with ADLs  Baseline: -30 degrees, 8/10 pain Goal status: INITIAL  2.  Pt. Will achieve a FOTO score of 65%  as to demonstrate improvement in self-perceived functional ability with daily activities. Baseline: 54% Goal status: INITIAL  3.  Pt will improve AROM of L knee to 0-130 as to demonstrate improved mobility and active function in line with PLOF Baseline:  Goal status: INITIAL   PLAN:  PT FREQUENCY: 2x/week  PT DURATION: 6 weeks  PLANNED INTERVENTIONS: 97110-Therapeutic exercises, 97530- Therapeutic activity, 97112- Neuromuscular re-education, 97535- Self Care, 16109- Manual therapy, [phone]- Gait training, Balance training, Stair training, and Joint mobilization  PLAN FOR NEXT SESSION: Review HEP, Quadriceps recruitment, TKE in stance, standing tolerance.   Sheliah Plane, PT, DPT 09/04/2023, 9:09 AM

## 2023-09-06 ENCOUNTER — Ambulatory Visit: Payer: Medicaid Other

## 2023-09-06 DIAGNOSIS — M25562 Pain in left knee: Secondary | ICD-10-CM

## 2023-09-06 DIAGNOSIS — M23307 Other meniscus derangements, unspecified meniscus, left knee: Secondary | ICD-10-CM
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
## 2024-07-08 LAB — CYTOLOGY - PAP

## 2024-07-25 ENCOUNTER — Ambulatory Visit: Admitting: Podiatry

## 2024-07-25 ENCOUNTER — Encounter: Payer: Self-pay | Admitting: Podiatry

## 2024-07-25 DIAGNOSIS — L600 Ingrowing nail: Secondary | ICD-10-CM

## 2024-07-25 NOTE — Patient Instructions (Signed)

## 2024-07-25 NOTE — Progress Notes (Signed)
 Subjective:   Patient ID: Christina [name], female   DOB: 48 y.o.   MRN: [id number]   HPI Patient presents with a lot of pain in the fourth digits bilateral and gets pain at times with lesions big toes.  Patient is active does smoke a pack of cigarettes per day and tries to be active   Review of Systems  All other systems reviewed and are negative.       Objective:  Physical Exam Vitals and nursing note reviewed.  Constitutional:      Appearance: She is well-developed.  Pulmonary:     Effort: Pulmonary effort is normal.  Musculoskeletal:        General: Normal range of motion.  Skin:    General: Skin is warm.  Neurological:     Mental Status: She is alert.     Neurovascular status intact muscle strength adequate range of motion adequate with incurvation lateral border fourth toe bilateral painful when pressed localized to this area with keratotic lesions of the hallux bilateral.  Good digital perfusion well-oriented x 3     Assessment:  Ingrown toenail deformity fourth bilateral lateral border and keratotic tissue formation bilateral hallux     Plan:  H&P all conditions reviewed and recommended removal of the nail borders explaining there may eventually be digital procedures necessary but this would be the simplest way to try to get it better.  She wants to do procedure I allowed her to read and signed consent form understanding risk and I infiltrated each fourth toe 60 mg like Marcaine  mixture sterile prep done using sterile instrumentation removed the lateral borders exposed matrix applied phenol 3 applications 30 seconds followed by alcohol lavage sterile dressing gave instructions on soaks wear dressings 24 hours take them off earlier if throbbing were to occur did courtesy debridement of lesions and applied cushioning to the big toes bilateral

## 2024-08-15 ENCOUNTER — Telehealth: Payer: Self-pay

## 2024-08-15 NOTE — Telephone Encounter (Signed)
 Patient called stating that the toes she had procedure done on are oozing,swollen and sore. She states that She has been doing the epsom salt soaks as directed. She stated that the drainage has blood in it. When she takes the band aid off there is blood. She has tried to wear sneakers,but had to go right back into bedroom shoes because of the pain. She doesn't know if she needs an antibiotic or not. If an antibiotic is being sentin please send to [facility] on [address].

## 2024-08-21 NOTE — Telephone Encounter (Signed)
 That should be normal and should start drying soon. Antibiotic if it continues

## 2024-09-10 ENCOUNTER — Other Ambulatory Visit (HOSPITAL_COMMUNITY)
Admission: RE | Admit: 2024-09-10 | Discharge: 2024-09-10 | Disposition: A | Source: Ambulatory Visit | Attending: Obstetrics and Gynecology | Admitting: Obstetrics and Gynecology

## 2024-09-10 ENCOUNTER — Ambulatory Visit: Admitting: Obstetrics and Gynecology

## 2024-09-10 ENCOUNTER — Other Ambulatory Visit: Payer: Self-pay

## 2024-09-10 ENCOUNTER — Encounter: Payer: Self-pay | Admitting: Obstetrics and Gynecology

## 2024-09-10 VITALS — BP 107/74 | HR 75 | Wt 154.4 lb

## 2024-09-10 DIAGNOSIS — Z79890 Hormone replacement therapy (postmenopausal): Secondary | ICD-10-CM

## 2024-09-10 DIAGNOSIS — Z1231 Encounter for screening mammogram for malignant neoplasm of breast: Secondary | ICD-10-CM

## 2024-09-10 DIAGNOSIS — R232 Flushing: Secondary | ICD-10-CM

## 2024-09-10 DIAGNOSIS — N898 Other specified noninflammatory disorders of vagina: Secondary | ICD-10-CM

## 2024-09-10 DIAGNOSIS — N879 Dysplasia of cervix uteri, unspecified: Secondary | ICD-10-CM

## 2024-09-10 LAB — POCT PREGNANCY, URINE: Preg Test, Ur: NEGATIVE

## 2024-09-10 MED ORDER — IBUPROFEN 800 MG PO TABS: 800.0000 mg | ORAL_TABLET | Freq: Three times a day (TID) | ORAL | 1 refills | Status: DC | PRN

## 2024-09-10 MED ORDER — IBUPROFEN 800 MG PO TABS
800.0000 mg | ORAL_TABLET | Freq: Once | ORAL | Status: AC
  Administered 2024-09-10: 800 mg via ORAL

## 2024-09-10 NOTE — Procedures (Signed)
 Colposcopy Procedure Note  Pre-operative Diagnosis:  07/08/2024 PCP (Dr. 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 Cervical exam performed in the presence of a chaperone The risks (including infection, bleeding, pain) and benefits of the procedure were explained to the patient and written informed consent was obtained.  The patient was placed in the dorsal lithotomy position. A Pederson was speculum inserted in the vagina, and the cervix was visualized.  Acetic acid staining was done and the cervix was viewed with green filter  Biopsy from 4, 9 and 1 o'clock and then an endocervical curettage in all four quadrants done. There was no bleeding after procedure with Monsel's application   Findings: increased vascularity diffusely and ?nabothian cyst at 4 o'clock  Adequate: Yes  Specimens: cervical 4, 9, and 1 o'clock (sent together) and endocervical curettage  Condition: Stable  Complications: None  Plan: Patient states she smokes 1ppd and recommend elimination to help with eliminating HPV  The patient was advised to call for any fever or for prolonged or severe pain or bleeding. She was advised to use OTC analgesics as needed for mild to moderate pain.   Bebe Izell Raddle MD Attending [facility] for [employer] [facility])

## 2024-09-11 LAB — FOLLICLE STIMULATING HORMONE: FSH: 64.7 m[IU]/mL

## 2024-09-11 LAB — ESTRADIOL: Estradiol: 36.4 pg/mL

## 2024-09-12 LAB — CERVICOVAGINAL ANCILLARY ONLY
Bacterial Vaginitis (gardnerella): POSITIVE — AB
Candida Glabrata: NEGATIVE
Candida Vaginitis: NEGATIVE
Comment: NEGATIVE
Comment: NEGATIVE
Comment: NEGATIVE

## 2024-09-12 LAB — SURGICAL PATHOLOGY

## 2024-09-13 ENCOUNTER — Ambulatory Visit: Payer: Self-pay | Admitting: Obstetrics and Gynecology

## 2024-09-13 MED ORDER — METRONIDAZOLE 500 MG PO TABS: 500.0000 mg | ORAL_TABLET | Freq: Two times a day (BID) | ORAL | 0 refills | Status: AC

## 2024-09-19 ENCOUNTER — Ambulatory Visit

## 2024-10-21 ENCOUNTER — Ambulatory Visit: Payer: Self-pay | Admitting: Obstetrics and Gynecology
